# Patient Record
Sex: Female | Born: 1985
Health system: Southern US, Community
[De-identification: ages and names within clinical notes are randomized; demographics above are authoritative.]

## PROBLEM LIST (undated history)

## (undated) ENCOUNTER — Inpatient Hospital Stay (HOSPITAL_COMMUNITY): Payer: Self-pay

## (undated) DIAGNOSIS — F419 Anxiety disorder, unspecified: Secondary | ICD-10-CM

## (undated) DIAGNOSIS — O24419 Gestational diabetes mellitus in pregnancy, unspecified control: Secondary | ICD-10-CM

## (undated) DIAGNOSIS — S8291XA Unspecified fracture of right lower leg, initial encounter for closed fracture: Secondary | ICD-10-CM

## (undated) DIAGNOSIS — L309 Dermatitis, unspecified: Secondary | ICD-10-CM

## (undated) HISTORY — DX: Gestational diabetes mellitus in pregnancy, unspecified control: O24.419

## (undated) HISTORY — PX: MOUTH SURGERY: SHX715

---
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
## 2007-10-05 ENCOUNTER — Ambulatory Visit (HOSPITAL_COMMUNITY): Admission: RE | Admit: 2007-10-05 | Discharge: 2007-10-05 | Payer: Self-pay | Admitting: Emergency Medicine

## 2010-12-28 ENCOUNTER — Encounter (INDEPENDENT_AMBULATORY_CARE_PROVIDER_SITE_OTHER): Payer: Self-pay | Admitting: Surgery

## 2010-12-28 ENCOUNTER — Ambulatory Visit (INDEPENDENT_AMBULATORY_CARE_PROVIDER_SITE_OTHER): Payer: BC Managed Care – PPO | Admitting: Surgery

## 2010-12-28 VITALS — BP 122/76 | HR 60 | Temp 97.2°F | Resp 16 | Ht 64.0 in | Wt 136.1 lb

## 2010-12-28 DIAGNOSIS — K8000 Calculus of gallbladder with acute cholecystitis without obstruction: Secondary | ICD-10-CM

## 2010-12-28 NOTE — Patient Instructions (Signed)
Laparoscopic Cholecystectomy Laparoscopic cholecystectomy is surgery to remove the gallbladder. The gallbladder is located slightly to the right of center in the abdomen, behind the liver. It is a concentrating and storage sac for the bile produced in the liver. Bile aids in the digestion and absorption of fats. Gallbladder disease (cholecystitis) is an inflammation of your gallbladder. This condition is usually caused by a buildup of gallstones (cholelithiasis) in your gallbladder. Gallstones can block the flow of bile, resulting in inflammation and pain. In severe cases, emergency surgery may be required. When emergency surgery is not required, you will have time to prepare for the procedure. Laparoscopic surgery is an alternative to open surgery. Laparoscopic surgery usually has a shorter recovery time. Your common bile duct may also need to be examined and explored. Your caregiver will discuss this with you if he or she feels this should be done. If stones are found in the common bile duct, they may be removed. LET YOUR CAREGIVER KNOW ABOUT:  Allergies to food or medicine.   Medicines taken, including vitamins, herbs, eyedrops, over-the-counter medicines, and creams.   Use of steroids (by mouth or creams).   Previous problems with anesthetics or numbing medicines.   History of bleeding problems or blood clots.   Previous surgery.   Other health problems, including diabetes and kidney problems.   Possibility of pregnancy, if this applies.  RISKS AND COMPLICATIONS All surgery is associated with risks. Some problems that may occur following this procedure include:  Infection.   Damage to the common bile duct, nerves, arteries, veins, or other internal organs such as the stomach or intestines.   Bleeding.   A stone may remain in the common bile duct.  BEFORE THE PROCEDURE  Do not take aspirin for 3 days prior to surgery or blood thinners for 1 week prior to surgery.   Do not eat or  drink anything after midnight the night before surgery.   Let your caregiver know if you develop a cold or other infectious problem prior to surgery.   You should be present 60 minutes before the procedure or as directed.  PROCEDURE  You will be given medicine that makes you sleep (general anesthetic). When you are asleep, your surgeon will make several small cuts (incisions) in your abdomen. One of these incisions is used to insert a small, lighted scope (laparoscope) into the abdomen. The laparoscope helps the surgeon see into your abdomen. Carbon dioxide gas will be pumped into your abdomen. The gas allows more room for the surgeon to perform your surgery. Other operating instruments are inserted through the other incisions. Laparoscopic procedures may not be appropriate when:  There is major scarring from previous surgery.   The gallbladder is extremely inflamed.   There are bleeding disorders or unexpected cirrhosis of the liver.   A pregnancy is near term.   Other conditions make the laparoscopic procedure impossible.  If your surgeon feels it is not safe to continue with a laparoscopic procedure, he or she will perform an open abdominal procedure. In this case, the surgeon will make an incision to open the abdomen. This gives the surgeon a larger view and field to work within. This may allow the surgeon to perform procedures that sometimes cannot be performed with a laparoscope alone. Open surgery has a longer recovery time. AFTER THE PROCEDURE  You will be taken to the recovery area where a nurse will watch and check your progress.   You may be allowed to go home   the same day.   Do not resume physical activities until directed by your caregiver.   You may resume a normal diet and activities as directed.  Document Released: 12/25/2004 Document Revised: 09/06/2010 Document Reviewed: 06/09/2010 [facility] Patient Information 2012 [place], [place].Cholelithiasis Cholelithiasis (also  called gallstones) is a form of gallbladder disease where gallstones form in your gallbladder. The gallbladder is a non-essential organ that stores bile made in the liver, which helps digest fats. Gallstones begin as small crystals and slowly grow into stones. Gallstone pain occurs when the gallbladder spasms, and a gallstone is blocking the duct. Pain can also occur when a stone passes out of the duct.  Women are more likely to develop gallstones than men. Other factors that increase the risk of gallbladder disease are:  Having multiple pregnancies. Physicians sometimes advise removing diseased gallbladders before future pregnancies.   Obesity.   Diets heavy in fried foods and fat.   Increasing age (older than 50).   Prolonged use of medications containing female hormones.   Diabetes mellitus.   Rapid weight loss.   Family history of gallstones (heredity).  SYMPTOMS  Feeling sick to your stomach (nauseous).   Abdominal pain.   Yellowing of the skin (jaundice).   Sudden pain. It may persist from several minutes to several hours.   Worsening pain with deep breathing or when jarred.   Fever.   Tenderness to the touch.  In some cases, when gallstones do not move into the bile duct, people have no pain or symptoms. These are called "silent" gallstones. TREATMENT In severe cases, emergency surgery may be required. HOME CARE INSTRUCTIONS   Only take over-the-counter or prescription medicines for pain, discomfort, or fever as directed by your caregiver.   Follow a low-fat diet until seen again. Fat causes the gallbladder to contract, which can result in pain.   Follow up as instructed. Attacks are almost always recurrent and surgery is usually required for permanent treatment.  SEEK IMMEDIATE MEDICAL CARE IF:   Your pain increases and is not controlled by medications.   You have an oral temperature above 102 F (38.9 C), not controlled by medication.   You develop nausea  and vomiting.  MAKE SURE YOU:   Understand these instructions.   Will watch your condition.   Will get help right away if you are not doing well or get worse.  Document Released: 12/21/2004 Document Revised: 09/06/2010 Document Reviewed: 02/23/2010 [facility] Patient Information 2012 [place], [place].  Cholelithiasis Cholelithiasis (also called gallstones) is a form of gallbladder disease where gallstones form in your gallbladder. The gallbladder is a non-essential organ that stores bile made in the liver, which helps digest fats. Gallstones begin as small crystals and slowly grow into stones. Gallstone pain occurs when the gallbladder spasms, and a gallstone is blocking the duct. Pain can also occur when a stone passes out of the duct.  Women are more likely to develop gallstones than men. Other factors that increase the risk of gallbladder disease are:  Having multiple pregnancies. Physicians sometimes advise removing diseased gallbladders before future pregnancies.   Obesity.   Diets heavy in fried foods and fat.   Increasing age (older than 25).   Prolonged use of medications containing female hormones.   Diabetes mellitus.   Rapid weight loss.   Family history of gallstones (heredity).  SYMPTOMS  Feeling sick to your stomach (nauseous).   Abdominal pain.   Yellowing of the skin (jaundice).   Sudden pain. It may persist from several  minutes to several hours.   Worsening pain with deep breathing or when jarred.   Fever.   Tenderness to the touch.  In some cases, when gallstones do not move into the bile duct, people have no pain or symptoms. These are called "silent" gallstones. TREATMENT In severe cases, emergency surgery may be required. HOME CARE INSTRUCTIONS   Only take over-the-counter or prescription medicines for pain, discomfort, or fever as directed by your caregiver.   Follow a low-fat diet until seen again. Fat causes the gallbladder to contract, which  can result in pain.   Follow up as instructed. Attacks are almost always recurrent and surgery is usually required for permanent treatment.  SEEK IMMEDIATE MEDICAL CARE IF:   Your pain increases and is not controlled by medications.   You have an oral temperature above 102 F (38.9 C), not controlled by medication.   You develop nausea and vomiting.  MAKE SURE YOU:   Understand these instructions.   Will watch your condition.   Will get help right away if you are not doing well or get worse.  Document Released: 12/21/2004 Document Revised: 09/06/2010 Document Reviewed: 02/23/2010 [facility] Patient Information 2012 [place], [place].

## 2010-12-28 NOTE — Progress Notes (Signed)
Patient ID: Catherine Patel, female   DOB: [birth year], 25 y.o.   MRN: [id number]  Chief Complaint  Patient presents with  . New Evaluation    eval of Gb with stones     HPI Catherine Patel is a 25 y.o. female. HPI The patient was sent at the request of Dr. Evette Cristal due to epigastric abdominal pain and nausea. She's had these symptoms for one month. The symptoms last hours and go away on room. The discomfort is located in her epigastrium and right upper quadrant and follows eating. The discomfort is a 3-4/10 and radiates to her back. Diet modification has helped to alleviate some of the symptoms. The nausea is the dominant symptom after meals. TUMS does help some.  Past Medical History  Diagnosis Date  . Asthma     History reviewed. No pertinent past surgical history.  History reviewed. No pertinent family history.  Social History History  Substance Use Topics  . Smoking status: Never Smoker   . Smokeless tobacco: Never Used  . Alcohol Use: Yes    Allergies  Allergen Reactions  . Codeine     Current Outpatient Prescriptions  Medication Sig Dispense Refill  . esomeprazole (NEXIUM) 10 MG packet Take 10 mg by mouth daily before breakfast.        . ValACYclovir HCl (VALTREX PO) Take 0.5 mg by mouth daily.          Review of Systems Review of Systems  Constitutional: Negative for fever, chills and unexpected weight change.  HENT: Negative for hearing loss, congestion, sore throat, trouble swallowing and voice change.   Eyes: Negative for visual disturbance.  Respiratory: Negative for cough and wheezing.   Cardiovascular: Negative for chest pain, palpitations and leg swelling.  Gastrointestinal: Negative for nausea, vomiting, abdominal pain, diarrhea, constipation, blood in stool, abdominal distention and anal bleeding.  Genitourinary: Negative for hematuria, vaginal bleeding and difficulty urinating.  Musculoskeletal: Negative for arthralgias.  Skin: Negative for rash and wound.   Neurological: Negative for seizures, syncope and headaches.  Hematological: Negative for adenopathy. Does not bruise/bleed easily.  Psychiatric/Behavioral: Negative for confusion.    Blood pressure 122/76, pulse 60, temperature 97.2 F (36.2 C), temperature source Temporal, resp. rate 16, height 5\' 4"  (1.626 m), weight 136 lb 2 oz (61.746 kg).  Physical Exam Physical Exam  Constitutional: She is oriented to person, place, and time. She appears well-developed and well-nourished.  HENT:  Head: Normocephalic and atraumatic.  Eyes: EOM are normal. Pupils are equal, round, and reactive to light.  Neck: Normal range of motion. Neck supple.  Cardiovascular: Normal rate and regular rhythm.   Pulmonary/Chest: Effort normal and breath sounds normal.  Abdominal: Soft. Bowel sounds are normal. There is no tenderness. There is no rebound.  Musculoskeletal: Normal range of motion.  Neurological: She is alert and oriented to person, place, and time.  Skin: Skin is warm.  Psychiatric: She has a normal mood and affect. Her behavior is normal. Judgment and thought content normal.    Data Reviewed U/S abdomen gallstones LFT normal CBC normal  Assessment    Symptomatic cholelithiasis without obstruction    Plan    I discussed treatment options for condition. Risks, benefits and medical therapies were discussed today as well surgical therapy for cholelithiasis which is symptomatic. She would like to proceed with laparoscopic cholecystectomy with cholangiogram.The procedure has been discussed with the patient. Operative and non operative treatments have been discussed. Risks of surgery include bleeding, infection,  Common bile  duct injury,  Injury to the stomach,liver, colon,small intestine, abdominal wall,  Diaphragm,  Major blood vessels,  And the need for an open procedure.  Other risks include worsening of medical problems, death,  DVT and pulmonary embolism, and cardiovascular events.   Medical  options have also been discussed. The patient has been informed of long term expectations of surgery and non surgical options,  The patient agrees to proceed.         Tamsyn Owusu A. 12/28/2010, 10:08 AM

## 2011-01-05 ENCOUNTER — Encounter (INDEPENDENT_AMBULATORY_CARE_PROVIDER_SITE_OTHER): Payer: Self-pay

## 2011-01-10 ENCOUNTER — Other Ambulatory Visit (INDEPENDENT_AMBULATORY_CARE_PROVIDER_SITE_OTHER): Payer: Self-pay | Admitting: Surgery

## 2011-01-10 DIAGNOSIS — K824 Cholesterolosis of gallbladder: Secondary | ICD-10-CM

## 2011-01-10 DIAGNOSIS — K8010 Calculus of gallbladder with chronic cholecystitis without obstruction: Secondary | ICD-10-CM

## 2011-01-10 HISTORY — PX: CHOLECYSTECTOMY: SHX55

## 2011-01-18 ENCOUNTER — Ambulatory Visit: Payer: Self-pay | Admitting: Internal Medicine

## 2011-01-19 ENCOUNTER — Ambulatory Visit (INDEPENDENT_AMBULATORY_CARE_PROVIDER_SITE_OTHER): Payer: BC Managed Care – PPO | Admitting: Surgery

## 2011-01-19 ENCOUNTER — Encounter (INDEPENDENT_AMBULATORY_CARE_PROVIDER_SITE_OTHER): Payer: Self-pay | Admitting: Surgery

## 2011-01-19 VITALS — BP 102/68 | HR 66 | Temp 97.2°F | Resp 16 | Ht 64.0 in | Wt 137.6 lb

## 2011-01-19 DIAGNOSIS — Z9889 Other specified postprocedural states: Secondary | ICD-10-CM

## 2011-01-19 NOTE — Patient Instructions (Signed)
Follow up if symptoms don.t improve over the next 2 weeks.

## 2011-01-19 NOTE — Progress Notes (Signed)
Patient ID: [name], female   DOB: Oct 25, 1985, 26 y.o.   MRN: [id number] NAME: Catherine Patel       DOB: [birth year]           DATE: 01/19/2011       WUJ:[id number]   CC: Postop laparoscopic cholecystectomy  HPI:  This patient underwent a laparoscopic cholecystectomy  andoperative cholangiogram on 01/10/2011. She is in for her first postoperative visit. She notes that her incisional pain has resolved. Her preoperative symptoms have improved. She is not having problems with nausea, vomiting, diarrhea, fevers, chills, or urinary symptoms. She is tolerating diet. She feels that she is progressing well and nearly back to normal. PE: General: The patient is alert and appears comfortable, NAD.  Abdomen: Soft and benign. The incisions are healing nicely. There are no apparent problems.  Data reviewed: IOC:  normal Pathology:  gallstones and inflammation  Impression:  The patient appears to be doing well, with improvement in her symptoms.  Plan:  She may resume full activity and regular diet. She  will followup with [place] on a p.r.n. basis. I did tell her that she may still have some foods that cause indigestion and ask her to call us if there are any questions, problems or concerns.

## 2011-01-29 ENCOUNTER — Encounter (INDEPENDENT_AMBULATORY_CARE_PROVIDER_SITE_OTHER): Payer: Self-pay | Admitting: Surgery

## 2011-02-12 ENCOUNTER — Telehealth (INDEPENDENT_AMBULATORY_CARE_PROVIDER_SITE_OTHER): Payer: Self-pay

## 2011-02-12 NOTE — Telephone Encounter (Signed)
Pt states she has been having increasing anxiety and panic attacks since Jan. 2013. Pt asked what could be causing these. I advised pt to call Dr Andi Devon her PCP to review these symptoms. Pt states her wounds are healed and otherwise doing well since surgery.  Pt advised we do not treat anxiety disorders and it is very important that she follow up with her PCP to work up these symptoms. Pt states she will call him.

## 2012-07-21 LAB — OB RESULTS CONSOLE GC/CHLAMYDIA
CHLAMYDIA, DNA PROBE: NEGATIVE
GC PROBE AMP, GENITAL: NEGATIVE

## 2012-07-21 LAB — OB RESULTS CONSOLE HEPATITIS B SURFACE ANTIGEN: HEP B S AG: NEGATIVE

## 2012-07-21 LAB — OB RESULTS CONSOLE ABO/RH: RH TYPE: POSITIVE

## 2012-07-21 LAB — OB RESULTS CONSOLE HIV ANTIBODY (ROUTINE TESTING): HIV: NONREACTIVE

## 2012-07-21 LAB — OB RESULTS CONSOLE ANTIBODY SCREEN: Antibody Screen: NEGATIVE

## 2012-07-21 LAB — OB RESULTS CONSOLE RPR: RPR: NONREACTIVE

## 2012-07-21 LAB — OB RESULTS CONSOLE RUBELLA ANTIBODY, IGM: Rubella: IMMUNE

## 2012-10-06 ENCOUNTER — Other Ambulatory Visit: Payer: Self-pay

## 2012-10-07 ENCOUNTER — Other Ambulatory Visit (HOSPITAL_COMMUNITY): Payer: Self-pay | Admitting: Obstetrics and Gynecology

## 2012-10-07 DIAGNOSIS — IMO0002 Reserved for concepts with insufficient information to code with codable children: Secondary | ICD-10-CM

## 2012-10-07 DIAGNOSIS — Z0489 Encounter for examination and observation for other specified reasons: Secondary | ICD-10-CM

## 2012-10-08 ENCOUNTER — Encounter (HOSPITAL_COMMUNITY): Payer: Self-pay

## 2012-10-08 ENCOUNTER — Ambulatory Visit (HOSPITAL_COMMUNITY)
Admission: RE | Admit: 2012-10-08 | Discharge: 2012-10-08 | Disposition: A | Discharge status: Home / Self Care | Payer: BC Managed Care – PPO | Source: Ambulatory Visit | Attending: Obstetrics and Gynecology | Admitting: Obstetrics and Gynecology

## 2012-10-08 ENCOUNTER — Other Ambulatory Visit (HOSPITAL_COMMUNITY): Payer: Self-pay | Admitting: Obstetrics and Gynecology

## 2012-10-08 VITALS — BP 113/66 | HR 68

## 2012-10-08 DIAGNOSIS — IMO0002 Reserved for concepts with insufficient information to code with codable children: Secondary | ICD-10-CM

## 2012-10-08 DIAGNOSIS — Z3490 Encounter for supervision of normal pregnancy, unspecified, unspecified trimester: Secondary | ICD-10-CM

## 2012-10-08 DIAGNOSIS — Z0489 Encounter for examination and observation for other specified reasons: Secondary | ICD-10-CM

## 2012-10-08 DIAGNOSIS — Z1389 Encounter for screening for other disorder: Secondary | ICD-10-CM | POA: Insufficient documentation

## 2012-10-08 DIAGNOSIS — A6000 Herpesviral infection of urogenital system, unspecified: Secondary | ICD-10-CM | POA: Insufficient documentation

## 2012-10-08 DIAGNOSIS — Z363 Encounter for antenatal screening for malformations: Secondary | ICD-10-CM | POA: Insufficient documentation

## 2012-10-08 DIAGNOSIS — O358XX0 Maternal care for other (suspected) fetal abnormality and damage, not applicable or unspecified: Secondary | ICD-10-CM | POA: Insufficient documentation

## 2012-10-08 DIAGNOSIS — O98519 Other viral diseases complicating pregnancy, unspecified trimester: Secondary | ICD-10-CM | POA: Insufficient documentation

## 2012-10-08 NOTE — Progress Notes (Signed)
Tana Coast Mckinstry  was seen today for an ultrasound appointment.  See full report in [facility].  Comments: Ms. Gelles was seen due to incomplete views of the fetal anatomy by ultrasound earlier this week and possible Premature atrial contaction.  The fetal anatomic survey was within normal limits.  The fetal heart structure appears normal.  On initial evaluation, the fetus appeared to be in normal sinus rhythm at a rate of 145 bpm.  Later during the course of the exam, a fetal arrhythmia was noted  - frequent PACs vs. atrial bigemminy with a normal ventricular rate.  No evidence of fetal hydrops noted.  Based on these findings, the patient elected to undergo a formal fetal echo that was scheduled for later this week.  Impression: Single IUP at 18 5/7 weeks Normal fetal anatomic survey Fetal arrhythmia - frequent PACs vs. atrial bigemminy No evidence of fetal hydrops No markers associated with aneuploidy noted Normal amniotic fluid volume  Recommendations: Fetal echo (scheduled for later this week) Follow up in 4 weeks for growth, reevaluate fetal anatomy.  Alpha Gula, MD

## 2012-10-09 ENCOUNTER — Encounter: Payer: Self-pay | Admitting: Obstetrics and Gynecology

## 2012-11-05 ENCOUNTER — Ambulatory Visit (HOSPITAL_COMMUNITY)
Admission: RE | Admit: 2012-11-05 | Discharge: 2012-11-05 | Disposition: A | Discharge status: Home / Self Care | Payer: BC Managed Care – PPO | Source: Ambulatory Visit | Attending: Obstetrics and Gynecology | Admitting: Obstetrics and Gynecology

## 2012-11-05 DIAGNOSIS — Z3490 Encounter for supervision of normal pregnancy, unspecified, unspecified trimester: Secondary | ICD-10-CM

## 2012-11-05 DIAGNOSIS — A6000 Herpesviral infection of urogenital system, unspecified: Secondary | ICD-10-CM | POA: Insufficient documentation

## 2012-11-05 DIAGNOSIS — IMO0002 Reserved for concepts with insufficient information to code with codable children: Secondary | ICD-10-CM

## 2012-11-05 DIAGNOSIS — O368390 Maternal care for abnormalities of the fetal heart rate or rhythm, unspecified trimester, not applicable or unspecified: Secondary | ICD-10-CM | POA: Insufficient documentation

## 2012-11-05 DIAGNOSIS — O98519 Other viral diseases complicating pregnancy, unspecified trimester: Secondary | ICD-10-CM | POA: Insufficient documentation

## 2012-11-05 NOTE — Progress Notes (Signed)
Catherine Patel  was seen today for an ultrasound appointment.  See full report in [facility].  Impression: Single IUP at 22 5/7 weeks Follow up due to frequent fetal PACs  Normal fetal echo Interval growth is appropriate (57th %tile) Normal interval anatomy Throughout the course of the exam, normal sinus rhythm was noted without PACs Normal amniotic fluid volume  Recommendations: In general, PACs are a benign dysrhythmia that resolves after delivery.  There is a < 1% risk of PACs developing into fetal SVT.  Recommnend frequent fetal heart rate checks (clinic [place]) at least every over week.  In the absence of fetal tachycardia, no further follow up in needed.  Follow-up ultrasounds as clinically indicated.   Alpha Gula, MD

## 2012-12-17 ENCOUNTER — Encounter: Payer: BC Managed Care – PPO | Attending: Obstetrics and Gynecology

## 2012-12-17 VITALS — Ht 65.0 in | Wt 166.3 lb

## 2012-12-17 DIAGNOSIS — O99810 Abnormal glucose complicating pregnancy: Secondary | ICD-10-CM

## 2012-12-17 DIAGNOSIS — Z713 Dietary counseling and surveillance: Secondary | ICD-10-CM | POA: Insufficient documentation

## 2012-12-18 NOTE — Progress Notes (Signed)
  Patient was seen on 12/17/12 for Gestational Diabetes self-management class at the [facility]. The following learning objectives were met by the patient during this course:   States the definition of Gestational Diabetes  States why dietary management is important in controlling blood glucose  Describes the effects of carbohydrates on blood glucose levels  Demonstrates ability to create a balanced meal plan  Demonstrates carbohydrate counting   States when to check blood glucose levels  Demonstrates proper blood glucose monitoring techniques  States the effect of stress and exercise on blood glucose levels  States the importance of limiting caffeine and abstaining from alcohol and smoking  Plan:  Aim for 2 Carb Choices per meal (30 grams) +/- 1 either way for breakfast Aim for 3 Carb Choices per meal (45 grams) +/- 1 either way from lunch and dinner Aim for 1-2 Carbs per snack Begin reading food labels for Total Carbohydrate and sugar grams of foods Consider  increasing your activity level by walking daily as tolerated Begin checking BG before breakfast and 1-2 hours after first bit of breakfast, lunch and dinner after  as directed by MD  Take medication  as directed by MD  Blood glucose monitor given:  One Touch Ultra Mini Self Monitoring Kit Lot # [id number] X Exp: 03/2014 Blood glucose reading: 77  Patient instructed to monitor glucose levels: FBS: 60 - <90 1 hour: <140 2 hour: <120  Patient received the following handouts:  Nutrition Diabetes and Pregnancy  Carbohydrate Counting List  Meal Planning worksheet  Patient will be seen for follow-up as needed.

## 2013-01-05 ENCOUNTER — Encounter: Payer: Self-pay | Admitting: Obstetrics and Gynecology

## 2013-01-16 ENCOUNTER — Encounter (HOSPITAL_COMMUNITY): Payer: Self-pay | Admitting: *Deleted

## 2013-01-16 ENCOUNTER — Inpatient Hospital Stay (HOSPITAL_COMMUNITY)
Admission: AD | Admit: 2013-01-16 | Discharge: 2013-01-16 | Disposition: A | Discharge status: Home / Self Care | Payer: BC Managed Care – PPO | Source: Ambulatory Visit | Attending: Obstetrics and Gynecology | Admitting: Obstetrics and Gynecology

## 2013-01-16 ENCOUNTER — Inpatient Hospital Stay (HOSPITAL_COMMUNITY): Payer: BC Managed Care – PPO

## 2013-01-16 DIAGNOSIS — O479 False labor, unspecified: Secondary | ICD-10-CM

## 2013-01-16 DIAGNOSIS — R109 Unspecified abdominal pain: Secondary | ICD-10-CM | POA: Insufficient documentation

## 2013-01-16 DIAGNOSIS — O4700 False labor before 37 completed weeks of gestation, unspecified trimester: Secondary | ICD-10-CM | POA: Insufficient documentation

## 2013-01-16 HISTORY — DX: Unspecified fracture of right lower leg, initial encounter for closed fracture: S82.91XA

## 2013-01-16 HISTORY — DX: Dermatitis, unspecified: L30.9

## 2013-01-16 HISTORY — DX: Anxiety disorder, unspecified: F41.9

## 2013-01-16 LAB — WET PREP, GENITAL
CLUE CELLS WET PREP: NONE SEEN
TRICH WET PREP: NONE SEEN
Yeast Wet Prep HPF POC: NONE SEEN

## 2013-01-16 LAB — URINALYSIS, ROUTINE W REFLEX MICROSCOPIC
Bilirubin Urine: NEGATIVE
GLUCOSE, UA: NEGATIVE mg/dL
HGB URINE DIPSTICK: NEGATIVE
KETONES UR: NEGATIVE mg/dL
LEUKOCYTES UA: NEGATIVE
Nitrite: NEGATIVE
Protein, ur: NEGATIVE mg/dL
Specific Gravity, Urine: 1.015 (ref 1.005–1.030)
Urobilinogen, UA: 0.2 mg/dL (ref 0.0–1.0)
pH: 6 (ref 5.0–8.0)

## 2013-01-16 LAB — FETAL FIBRONECTIN: FETAL FIBRONECTIN: NEGATIVE

## 2013-01-16 MED ORDER — NIFEDIPINE 10 MG PO CAPS
10.0000 mg | ORAL_CAPSULE | Freq: Once | ORAL | Status: AC
  Administered 2013-01-16: 10 mg via ORAL
  Filled 2013-01-16: qty 1

## 2013-01-16 NOTE — Discharge Instructions (Signed)
Braxton Hicks Contractions Pregnancy is commonly associated with contractions of the uterus throughout the pregnancy. Towards the end of pregnancy (32 to 34 weeks), these contractions [facility](Braxton [facility]) can develop more often and may become more forceful. This is not true labor because these contractions do not result in opening (dilatation) and thinning of the cervix. They are sometimes difficult to tell apart from true labor because these contractions can be forceful and people have different pain tolerances. You should not feel embarrassed if you go to the hospital with false labor. Sometimes, the only way to tell if you are in true labor is for your caregiver to follow the changes in the cervix. How to tell the difference between true and false labor:  False labor.  The contractions of false labor are usually shorter, irregular and not as hard as those of true labor.  They are often felt in the front of the lower abdomen and in the groin.  They may leave with walking around or changing positions while lying down.  They get weaker and are shorter lasting as time goes on.  These contractions are usually irregular.  They do not usually become progressively stronger, regular and closer together as with true labor.  True labor.  Contractions in true labor last 30 to 70 seconds, become very regular, usually become more intense, and increase in frequency.  They do not go away with walking.  The discomfort is usually felt in the top of the uterus and spreads to the lower abdomen and low back.  True labor can be determined by your caregiver with an exam. This will show that the cervix is dilating and getting thinner. If there are no prenatal problems or other health problems associated with the pregnancy, it is completely safe to be sent home with false labor and await the onset of true labor. HOME CARE INSTRUCTIONS   Keep up with your usual exercises and instructions.  Take medications as  directed.  Keep your regular prenatal appointment.  Eat and drink lightly if you think you are going into labor.  If BH contractions are making you uncomfortable:  Change your activity position from lying down or resting to walking/walking to resting.  Sit and rest in a tub of warm water.  Drink 2 to 3 glasses of water. Dehydration may cause B-H contractions.  Do slow and deep breathing several times an hour. SEEK IMMEDIATE MEDICAL CARE IF:   Your contractions continue to become stronger, more regular, and closer together.  You have a gushing, burst or leaking of fluid from the vagina.  An oral temperature above 102 F (38.9 C) develops.  You have passage of blood-tinged mucus.  You develop vaginal bleeding.  You develop continuous belly (abdominal) pain.  You have low back pain that you never had before.  You feel the baby's head pushing down causing pelvic pressure.  The baby is not moving as much as it used to. Document Released: 12/25/2004 Document Revised: 03/19/2011 Document Reviewed: 10/06/2012 [facility] Patient Information 2014 [place]. Preterm Labor Information Preterm labor is when labor starts at less than 37 weeks of pregnancy. The normal length of a pregnancy is 39 to 41 weeks. CAUSES Often, there is no identifiable underlying cause as to why a woman goes into preterm labor. One of the most common known causes of preterm labor is infection. Infections of the uterus, cervix, vagina, amniotic sac, bladder, kidney, or even the lungs (pneumonia) can cause labor to start. Other suspected causes of  of preterm labor include:  °· Urogenital infections, such as yeast infections and bacterial vaginosis.   °· Uterine abnormalities (uterine shape, uterine septum, fibroids, or bleeding from the placenta).   °· A cervix that has been operated on (it may fail to stay closed).   °· Malformations in the fetus.   °· Multiple gestations (twins, triplets, and so on).    °· Breakage of the amniotic sac.   °RISK FACTORS °· Having a previous history of preterm labor.   °· Having premature rupture of membranes (PROM).   °· Having a placenta that covers the opening of the cervix (placenta previa).   °· Having a placenta that separates from the uterus (placental abruption).   °· Having a cervix that is too weak to hold the fetus in the uterus (incompetent cervix).   °· Having too much fluid in the amniotic sac (polyhydramnios).   °· Taking illegal drugs or smoking while pregnant.   °· Not gaining enough weight while pregnant.   °· Being younger than 18 and older than 27 years old.   °· Having a low socioeconomic status.   °· Being African American. °SYMPTOMS °Signs and symptoms of preterm labor include:  °· Menstrual-like cramps, abdominal pain, or back pain. °· Uterine contractions that are regular, as frequent as six in an hour, regardless of their intensity (may be mild or painful). °· Contractions that start on the top of the uterus and spread down to the lower abdomen and back.   °· A sense of increased pelvic pressure.   °· A watery or bloody mucus discharge that comes from the vagina.   °TREATMENT °Depending on the length of the pregnancy and other circumstances, your health care provider may suggest bed rest. If necessary, there are medicines that can be given to stop contractions and to mature the fetal lungs. If labor happens before 34 weeks of pregnancy, a prolonged hospital stay may be recommended. Treatment depends on the condition of both you and the fetus.  °WHAT SHOULD YOU DO IF YOU THINK YOU ARE IN PRETERM LABOR? °Call your health care provider right away. You will need to go to the hospital to get checked immediately. °HOW CAN YOU PREVENT PRETERM LABOR IN FUTURE PREGNANCIES? °You should:  °· Stop smoking if you smoke.  °· Maintain healthy weight gain and avoid chemicals and drugs that are not necessary. °· Be watchful for any type of infection. °· Inform your health  care provider if you have a known history of preterm labor. °Document Released: 03/17/2003 Document Revised: 08/27/2012 Document Reviewed: 01/28/2012 °[employer]® Patient Information ©2014 [employer]. ° ° °

## 2013-01-16 NOTE — MAU Note (Signed)
Had a sharp pain yesterday morning, only lasted a few seconds.  Went to the bathroom, lg wad of mucous.  Was crampy all day. Called dr, told to push fluids. Still having cramping in lower abd.  Some vag /rectal pressure.

## 2013-01-16 NOTE — MAU Provider Note (Signed)
History     CSN: [id number]  Arrival date and time: 01/16/13 1707   First Provider Initiated Contact with Patient 01/16/13 1825      Chief Complaint  Patient presents with  . Abdominal Cramping   HPI  Ms. Tana Coastllison B Boisselle is a 28 y.o. female G1P0000 at [id number] who presents with complaints of abdominal cramping and increase in vaginal mucous. Pt continued to have abdominal pain and contractions, and pelvic pressure throughout the day. She called her dr. Isidore Moosffice today and they instructed her to come in for evaluation. Last intercourse was greater than 24 hours ago. She states that on occasion she gets a sharp pain that shoots down to her vagina.   OB History   Grav Para Term Preterm Abortions TAB SAB Ect Mult Living   1 0 0 0 0 0 0 0 0 0       Past Medical History  Diagnosis Date  . Asthma   . Gestational diabetes     diet controlled  . Infection     UTI  . Eczema   . Leg fracture, right     as child  . Anxiety     on meds doing well    Past Surgical History  Procedure Laterality Date  . Cholecystectomy  01/10/11    Family History  Problem Relation Age of Onset  . Hypertension Mother   . Hypothyroidism Mother   . Diabetes Father   . Hyperthyroidism Maternal Grandmother   . Cancer Maternal Grandmother     breast x2  . Cancer Maternal Grandfather     oral   . Diabetes Paternal Grandmother   . Diabetes Paternal Grandfather   . Hearing loss Neg Hx     History  Substance Use Topics  . Smoking status: Never Smoker   . Smokeless tobacco: Never Used  . Alcohol Use: Yes     Comment: not in preg    Allergies:  Allergies  Allergen Reactions  . Codeine     Prescriptions prior to admission  Medication Sig Dispense Refill  . acetaminophen (TYLENOL) 325 MG tablet Take 650 mg by mouth every 6 (six) hours as needed for mild pain, moderate pain, fever or headache.      . Docosahexaenoic Acid (PRENATAL DHA) 200 MG CAPS Take 1 capsule by mouth at bedtime.      .  Doxylamine-Pyridoxine (DICLEGIS) 10-10 MG TBEC Take 1-2 tablets by mouth at bedtime as needed and may repeat dose one time if needed.      . sertraline (ZOLOFT) 25 MG tablet Take 25 mg by mouth at bedtime.       . valACYclovir (VALTREX) 1000 MG tablet Take 1,000 mg by mouth daily as needed (for symptoms).       Recent Results (from the past 2160 hour(s))  URINALYSIS, ROUTINE W REFLEX MICROSCOPIC     Status: None   Collection Time    01/16/13  5:30 PM      Result Value Range   Color, Urine YELLOW  YELLOW   APPearance CLEAR  CLEAR   Specific Gravity, Urine 1.015  1.005 - 1.030   pH 6.0  5.0 - 8.0   Glucose, UA NEGATIVE  NEGATIVE mg/dL   Hgb urine dipstick NEGATIVE  NEGATIVE   Bilirubin Urine NEGATIVE  NEGATIVE   Ketones, ur NEGATIVE  NEGATIVE mg/dL   Protein, ur NEGATIVE  NEGATIVE mg/dL   Urobilinogen, UA 0.2  0.0 - 1.0 mg/dL   Nitrite NEGATIVE  NEGATIVE  Leukocytes, UA NEGATIVE  NEGATIVE   Comment: MICROSCOPIC NOT DONE ON URINES WITH NEGATIVE PROTEIN, BLOOD, LEUKOCYTES, NITRITE, OR GLUCOSE <1000 mg/dL.  WET PREP, GENITAL     Status: Abnormal   Collection Time    01/16/13  6:45 PM      Result Value Range   Yeast Wet Prep HPF POC NONE SEEN  NONE SEEN   Trich, Wet Prep NONE SEEN  NONE SEEN   Clue Cells Wet Prep HPF POC NONE SEEN  NONE SEEN   WBC, Wet Prep HPF POC FEW (*) NONE SEEN   Comment: MODERATE BACTERIA SEEN  FETAL FIBRONECTIN     Status: None   Collection Time    01/16/13  6:45 PM      Result Value Range   Fetal Fibronectin NEGATIVE  NEGATIVE    Review of Systems  Gastrointestinal: Positive for abdominal pain. Negative for nausea, vomiting, diarrhea and constipation.       + pelvic pressure   Genitourinary: Negative for dysuria, urgency, frequency and hematuria.       + vaginal discharge; white, thick, mucus like at times No vaginal bleeding. No dysuria.    Physical Exam   Blood pressure 131/89, pulse 79, temperature 98.1 F (36.7 C), temperature source Oral,  resp. rate 18, height 5' 3.5" (1.613 m), weight 78.926 kg (174 lb), last menstrual period 05/22/2012.  Physical Exam  Constitutional: She is oriented to person, place, and time. She appears well-developed and well-nourished. No distress.  HENT:  Head: Normocephalic.  Eyes: Pupils are equal, round, and reactive to light.  Neck: Neck supple.  GI: Soft.  Genitourinary: Vaginal discharge found.  Speculum exam: Vagina - Moderate amount of creamy discharge, no odor Cervix - No contact bleeding Bimanual exam: Wet prep done Chaperone present for exam.   Neurological: She is alert and oriented to person, place, and time.  Skin: Skin is warm. She is not diaphoretic.  Psychiatric: Her behavior is normal.   Dilation: Fingertip (FT in outer os and closed inner os) Effacement (%): 20 Exam by:: Jerrye Bushy NP  Fetal Tracing: Baseline: 145 bpm  Variability: Moderate  Accelerations: 15x15 Decelerations: None Toco: Irregular with uterine irritability    MAU Course  Procedures None  MDM Wet prep Fetal fibronectin; negative  Consulted with Dr. Renaldo Fiddler; she would like patient to go for an [place] to assess cervical length.  Preliminary [place] report shows cervical length 3.4 cm Consulted with Dr. Renaldo Fiddler; reported to her regarding cervical length and negative fetal fibronectin. She would like patient to receive one dose of procardia.   Assessment and Plan  Report given to Sharen Counter who assumes care of the patient  Iona Hansen Rasch, NP 01/16/2013, 6:25 PM   A: 1. Braxton Hicks contractions     P: D/C home with PTL precautions Pt reports feeling improvement after time in MAU and especially after Procardia dose Increase PO fluids F/U in office  Return to MAU as needed  Sharen Counter Certified Nurse-Midwife

## 2013-02-11 LAB — OB RESULTS CONSOLE GBS: GBS: NEGATIVE

## 2013-02-20 ENCOUNTER — Inpatient Hospital Stay (HOSPITAL_COMMUNITY)
Admission: AD | Admit: 2013-02-20 | Discharge: 2013-02-20 | Disposition: A | Discharge status: Home / Self Care | Payer: BC Managed Care – PPO | Source: Ambulatory Visit | Attending: Obstetrics and Gynecology | Admitting: Obstetrics and Gynecology

## 2013-02-20 ENCOUNTER — Encounter (HOSPITAL_COMMUNITY): Payer: Self-pay

## 2013-02-20 DIAGNOSIS — O479 False labor, unspecified: Secondary | ICD-10-CM | POA: Insufficient documentation

## 2013-02-20 LAB — COMPREHENSIVE METABOLIC PANEL
ALK PHOS: 144 U/L — AB (ref 39–117)
ALT: 7 U/L (ref 0–35)
AST: 13 U/L (ref 0–37)
Albumin: 2.5 g/dL — ABNORMAL LOW (ref 3.5–5.2)
BUN: 8 mg/dL (ref 6–23)
CHLORIDE: 104 meq/L (ref 96–112)
CO2: 22 mEq/L (ref 19–32)
Calcium: 9.2 mg/dL (ref 8.4–10.5)
Creatinine, Ser: 0.7 mg/dL (ref 0.50–1.10)
GFR calc Af Amer: >90 mL/min (ref >90)
GFR calc non Af Amer: >90 mL/min (ref >90)
Glucose, Bld: 78 mg/dL (ref 70–99)
POTASSIUM: 4.5 meq/L (ref 3.7–5.3)
SODIUM: 137 meq/L (ref 137–147)
Total Protein: 5.9 g/dL — ABNORMAL LOW (ref 6.0–8.3)

## 2013-02-20 LAB — CBC
HCT: 32.8 % — ABNORMAL LOW (ref 36.0–46.0)
Hemoglobin: 11 g/dL — ABNORMAL LOW (ref 12.0–15.0)
MCH: 29.6 pg (ref 26.0–34.0)
MCHC: 33.5 g/dL (ref 30.0–36.0)
MCV: 88.4 fL (ref 78.0–100.0)
PLATELETS: 223 10*3/uL (ref 150–400)
RBC: 3.71 MIL/uL — ABNORMAL LOW (ref 3.87–5.11)
RDW: 13.1 % (ref 11.5–15.5)
WBC: 9.5 10*3/uL (ref 4.0–10.5)

## 2013-02-20 LAB — AMNISURE RUPTURE OF MEMBRANE (ROM) NOT AT ARMC: Amnisure ROM: NEGATIVE

## 2013-02-20 NOTE — Discharge Instructions (Signed)
Natural Childbirth °Natural childbirth is going through labor and delivery without any drugs to relieve pain. You also do not use fetal monitors, have a cesarean delivery, or get a sugical cut to enlarge the vaginal opening (episiotomy). With the help of a birthing professional (midwife), you will direct your own labor and delivery as you choose. °Many women chose natural childbirth because they feel more in control and in touch with their labor and delivery. They are also concerned about the medications affecting themselves and the baby. °Pregnant women with a high risk pregnancy should not attempt natural childbirth. It is better to deliver the infant in a hospital if an emergency situation arises. Sometimes, the caregiver has to intervene for the health and safety of the mother and infant. °TWO TECHNIQUES FOR NATURAL CHILDBIRTH:  °· The Lamaze method. This method teaches women that having a baby is normal, healthy, and natural. It also teaches the mother to take a neutral position regarding pain medication and anesthesia and to make an informed decision if and when it is right for them. °· The Bradley method (also called husband coached birth). This method teaches the father to be the birth coach and stresses a natural approach. It also encourages exercise and a balanced diet with good nutrition. The exercises teach relaxation and deep breathing techniques. However, there are also classes to prepare the parents for an emergency situation that may occur. °METHODS OF DEALING WITH LABOR PAIN AND DELIVERY: °· Meditation. °· Yoga. °· Hypnosis. °· Acupuncture. °· Massage. °· Changing positions (walking, rocking, showering, leaning on birth balls). °· Lying in warm water or a jacuzzi. °· Find an activity that keeps your mind off of the labor pain. °· Listen to soft music. °· Visual imagery (focus on a particular object). °BEFORE GOING INTO LABOR °· Be sure you and your spouse/partner are in agreement to have natural  childbirth. °· Decide if your caregiver or a midwife will deliver your baby. °· Decide if you will have your baby in the hospital, birthing center, or at home. °· If you have children, make plans to have someone to take care of them when you go to the hospital. °· Know the distance and the time it takes to go to the delivery center. Make a dry run to be sure. °· Have a bag packed with a night gown, bathrobe, and toiletries ready to take when you go into labor. °· Keep phone numbers of your family and friends handy if you need to call someone when you go into labor. °· Your spouse or partner should go to all the teaching classes. °· Talk with your caregiver about the possibility of a medical emergency and what will happen if that occurs. °ADVANTAGES OF NATURAL CHILDBIRTH °· You are in control of your labor and delivery. °· It is safe. °· There are no medications or anesthetics that may affect you and the fetus. °· There are no invasive procedures such as an episiotomy. °· You and your partner will work together, which can increase your bond. °· Meditation, yoga, massage, and breathing exercises can be learned while pregnant and help you when you are in labor and at delivery. °· In most delivery centers, the family and friends can be involved in the labor and delivery process. °DISADVANTAGES OF NATURAL CHILDBIRTH °· You will experience pain during your labor and delivery. °· The methods of helping relieve your labor pains may not work for you. °· You may feel embarrassed, disappointed, and like a failure   if you decide to change your mind during labor and not have natural childbirth. °AFTER THE DELIVERY °· You will be very tired. °· You will be uncomfortable because of your uterus contracting. You will feel soreness around the vagina. °· You may feel cold and shaky.This is a natural reaction. °· You will be excited, overwhelmed, accomplished, and proud to be a mother. °HOME CARE INSTRUCTIONS  °· Follow the advice and  instructions of your caregiver. °· Follow the instructions of your natural childbirth instructor (Lamaze or Bradley Method). °Document Released: 12/08/2007 Document Revised: 03/19/2011 Document Reviewed: 09/01/2012 °ExitCare® Patient Information ©2014 [employer]. ° °

## 2013-02-20 NOTE — MAU Note (Signed)
Patient states she is having contraction about every 5 minutes with a heavier than usual discharge. Denies bleeding and reports good fetal movement.

## 2013-02-26 ENCOUNTER — Inpatient Hospital Stay (HOSPITAL_COMMUNITY)
Admission: AD | Admit: 2013-02-26 | Discharge: 2013-02-26 | Disposition: A | Discharge status: Home / Self Care | Payer: BC Managed Care – PPO | Source: Ambulatory Visit | Attending: Obstetrics and Gynecology | Admitting: Obstetrics and Gynecology

## 2013-02-26 ENCOUNTER — Encounter (HOSPITAL_COMMUNITY): Payer: Self-pay | Admitting: *Deleted

## 2013-02-26 DIAGNOSIS — O479 False labor, unspecified: Secondary | ICD-10-CM | POA: Insufficient documentation

## 2013-02-26 NOTE — MAU Note (Signed)
contractions 

## 2013-02-26 NOTE — Discharge Instructions (Signed)
Braxton Hicks Contractions Pregnancy is commonly associated with contractions of the uterus throughout the pregnancy. Towards the end of pregnancy (32 to 34 weeks), these contractions (Braxton Hicks) can develop more often and may become more forceful. This is not true labor because these contractions do not result in opening (dilatation) and thinning of the cervix. They are sometimes difficult to tell apart from true labor because these contractions can be forceful and people have different pain tolerances. You should not feel embarrassed if you go to the hospital with false labor. Sometimes, the only way to tell if you are in true labor is for your caregiver to follow the changes in the cervix. How to tell the difference between true and false labor:  False labor.  The contractions of false labor are usually shorter, irregular and not as hard as those of true labor.  They are often felt in the front of the lower abdomen and in the groin.  They may leave with walking around or changing positions while lying down.  They get weaker and are shorter lasting as time goes on.  These contractions are usually irregular.  They do not usually become progressively stronger, regular and closer together as with true labor.  True labor.  Contractions in true labor last 30 to 70 seconds, become very regular, usually become more intense, and increase in frequency.  They do not go away with walking.  The discomfort is usually felt in the top of the uterus and spreads to the lower abdomen and low back.  True labor can be determined by your caregiver with an exam. This will show that the cervix is dilating and getting thinner. If there are no prenatal problems or other health problems associated with the pregnancy, it is completely safe to be sent home with false labor and await the onset of true labor. HOME CARE INSTRUCTIONS   Keep up with your usual exercises and instructions.  Take medications as  directed.  Keep your regular prenatal appointment.  Eat and drink lightly if you think you are going into labor.  If BH contractions are making you uncomfortable:  Change your activity position from lying down or resting to walking/walking to resting.  Sit and rest in a tub of warm water.  Drink 2 to 3 glasses of water. Dehydration may cause B-H contractions.  Do slow and deep breathing several times an hour. SEEK IMMEDIATE MEDICAL CARE IF:   Your contractions continue to become stronger, more regular, and closer together.  You have a gushing, burst or leaking of fluid from the vagina.  An oral temperature above 102 F (38.9 C) develops.  You have passage of blood-tinged mucus.  You develop vaginal bleeding.  You develop continuous belly (abdominal) pain.  You have low back pain that you never had before.  You feel the baby's head pushing down causing pelvic pressure.  The baby is not moving as much as it used to. Document Released: 12/25/2004 Document Revised: 03/19/2011 Document Reviewed: 10/06/2012 ExitCare Patient Information 2014 [employer].  Fetal Movement Counts Patient Name: __________________________________________________ Patient Due Date: ____________________ Performing a fetal movement count is highly recommended in high-risk pregnancies, but it is good for every pregnant woman to do. Your caregiver may ask you to start counting fetal movements at 28 weeks of the pregnancy. Fetal movements often increase:  After eating a full meal.  After physical activity.  After eating or drinking something sweet or cold.  At rest. Pay attention to when you feel   the baby is most active. This will help you notice a pattern of your baby's sleep and wake cycles and what factors contribute to an increase in fetal movement. It is important to perform a fetal movement count at the same time each day when your baby is normally most active.  HOW TO COUNT FETAL  MOVEMENTS 1. Find a quiet and comfortable area to sit or lie down on your left side. Lying on your left side provides the best blood and oxygen circulation to your baby. 2. Write down the day and time on a sheet of paper or in a journal. 3. Start counting kicks, flutters, swishes, rolls, or jabs in a 2 hour period. You should feel at least 10 movements within 2 hours. 4. If you do not feel 10 movements in 2 hours, wait 2 3 hours and count again. Look for a change in the pattern or not enough counts in 2 hours. SEEK MEDICAL CARE IF:  You feel less than 10 counts in 2 hours, tried twice.  There is no movement in over an hour.  The pattern is changing or taking longer each day to reach 10 counts in 2 hours.  You feel the baby is not moving as he or she usually does. Date: ____________ Movements: ____________ Start time: ____________ Finish time: ____________  Date: ____________ Movements: ____________ Start time: ____________ Finish time: ____________ Date: ____________ Movements: ____________ Start time: ____________ Finish time: ____________ Date: ____________ Movements: ____________ Start time: ____________ Finish time: ____________ Date: ____________ Movements: ____________ Start time: ____________ Finish time: ____________ Date: ____________ Movements: ____________ Start time: ____________ Finish time: ____________ Date: ____________ Movements: ____________ Start time: ____________ Finish time: ____________ Date: ____________ Movements: ____________ Start time: ____________ Finish time: ____________  Date: ____________ Movements: ____________ Start time: ____________ Finish time: ____________ Date: ____________ Movements: ____________ Start time: ____________ Finish time: ____________ Date: ____________ Movements: ____________ Start time: ____________ Finish time: ____________ Date: ____________ Movements: ____________ Start time: ____________ Finish time: ____________ Date: ____________  Movements: ____________ Start time: ____________ Finish time: ____________ Date: ____________ Movements: ____________ Start time: ____________ Finish time: ____________ Date: ____________ Movements: ____________ Start time: ____________ Finish time: ____________  Date: ____________ Movements: ____________ Start time: ____________ Finish time: ____________ Date: ____________ Movements: ____________ Start time: ____________ Finish time: ____________ Date: ____________ Movements: ____________ Start time: ____________ Finish time: ____________ Date: ____________ Movements: ____________ Start time: ____________ Finish time: ____________ Date: ____________ Movements: ____________ Start time: ____________ Finish time: ____________ Date: ____________ Movements: ____________ Start time: ____________ Finish time: ____________ Date: ____________ Movements: ____________ Start time: ____________ Finish time: ____________  Date: ____________ Movements: ____________ Start time: ____________ Finish time: ____________ Date: ____________ Movements: ____________ Start time: ____________ Finish time: ____________ Date: ____________ Movements: ____________ Start time: ____________ Finish time: ____________ Date: ____________ Movements: ____________ Start time: ____________ Finish time: ____________ Date: ____________ Movements: ____________ Start time: ____________ Finish time: ____________ Date: ____________ Movements: ____________ Start time: ____________ Finish time: ____________ Date: ____________ Movements: ____________ Start time: ____________ Finish time: ____________  Date: ____________ Movements: ____________ Start time: ____________ Finish time: ____________ Date: ____________ Movements: ____________ Start time: ____________ Finish time: ____________ Date: ____________ Movements: ____________ Start time: ____________ Finish time: ____________ Date: ____________ Movements: ____________ Start time:  ____________ Finish time: ____________ Date: ____________ Movements: ____________ Start time: ____________ Finish time: ____________ Date: ____________ Movements: ____________ Start time: ____________ Finish time: ____________ Date: ____________ Movements: ____________ Start time: ____________ Finish time: ____________  Date: ____________ Movements: ____________ Start time: ____________ Finish time: ____________ Date: ____________ Movements: ____________ Start   time: ____________ Finish time: ____________ Date: ____________ Movements: ____________ Start time: ____________ Finish time: ____________ Date: ____________ Movements: ____________ Start time: ____________ Finish time: ____________ Date: ____________ Movements: ____________ Start time: ____________ Finish time: ____________ Date: ____________ Movements: ____________ Start time: ____________ Finish time: ____________ Date: ____________ Movements: ____________ Start time: ____________ Finish time: ____________  Date: ____________ Movements: ____________ Start time: ____________ Finish time: ____________ Date: ____________ Movements: ____________ Start time: ____________ Finish time: ____________ Date: ____________ Movements: ____________ Start time: ____________ Finish time: ____________ Date: ____________ Movements: ____________ Start time: ____________ Finish time: ____________ Date: ____________ Movements: ____________ Start time: ____________ Finish time: ____________ Date: ____________ Movements: ____________ Start time: ____________ Finish time: ____________ Date: ____________ Movements: ____________ Start time: ____________ Finish time: ____________  Date: ____________ Movements: ____________ Start time: ____________ Finish time: ____________ Date: ____________ Movements: ____________ Start time: ____________ Finish time: ____________ Date: ____________ Movements: ____________ Start time: ____________ Finish time: ____________ Date:  ____________ Movements: ____________ Start time: ____________ Finish time: ____________ Date: ____________ Movements: ____________ Start time: ____________ Finish time: ____________ Date: ____________ Movements: ____________ Start time: ____________ Finish time: ____________ Document Released: 01/24/2006 Document Revised: 12/12/2011 Document Reviewed: 10/22/2011 ExitCare Patient Information 2014 [employer].  

## 2013-02-26 NOTE — MAU Note (Signed)
Pt states she started having back pain today after visit in the office and having membranes stripped.

## 2013-02-28 ENCOUNTER — Inpatient Hospital Stay (HOSPITAL_COMMUNITY)
Admission: AD | Admit: 2013-02-28 | Discharge: 2013-03-02 | DRG: 775 | Disposition: A | Discharge status: Home / Self Care | Payer: BC Managed Care – PPO | Source: Ambulatory Visit | Attending: Obstetrics and Gynecology | Admitting: Obstetrics and Gynecology

## 2013-02-28 ENCOUNTER — Encounter (HOSPITAL_COMMUNITY): Payer: Self-pay | Admitting: *Deleted

## 2013-02-28 ENCOUNTER — Inpatient Hospital Stay (HOSPITAL_COMMUNITY): Payer: BC Managed Care – PPO | Admitting: Anesthesiology

## 2013-02-28 ENCOUNTER — Encounter (HOSPITAL_COMMUNITY): Payer: BC Managed Care – PPO | Admitting: Anesthesiology

## 2013-02-28 DIAGNOSIS — Z3490 Encounter for supervision of normal pregnancy, unspecified, unspecified trimester: Secondary | ICD-10-CM

## 2013-02-28 DIAGNOSIS — O99344 Other mental disorders complicating childbirth: Secondary | ICD-10-CM | POA: Diagnosis present

## 2013-02-28 DIAGNOSIS — F411 Generalized anxiety disorder: Secondary | ICD-10-CM | POA: Diagnosis present

## 2013-02-28 LAB — CBC
HEMATOCRIT: 32.6 % — AB (ref 36.0–46.0)
Hemoglobin: 11.3 g/dL — ABNORMAL LOW (ref 12.0–15.0)
MCH: 30.1 pg (ref 26.0–34.0)
MCHC: 34.7 g/dL (ref 30.0–36.0)
MCV: 86.7 fL (ref 78.0–100.0)
Platelets: 253 10*3/uL (ref 150–400)
RBC: 3.76 MIL/uL — AB (ref 3.87–5.11)
RDW: 13.2 % (ref 11.5–15.5)
WBC: 14 10*3/uL — ABNORMAL HIGH (ref 4.0–10.5)

## 2013-02-28 LAB — RPR: RPR Ser Ql: NONREACTIVE

## 2013-02-28 MED ORDER — PHENYLEPHRINE 40 MCG/ML (10ML) SYRINGE FOR IV PUSH (FOR BLOOD PRESSURE SUPPORT): 80.0000 ug | PREFILLED_SYRINGE | INTRAVENOUS | Status: DC | PRN

## 2013-02-28 MED ORDER — PHENYLEPHRINE 40 MCG/ML (10ML) SYRINGE FOR IV PUSH (FOR BLOOD PRESSURE SUPPORT)
80.0000 ug | PREFILLED_SYRINGE | INTRAVENOUS | Status: DC | PRN
  Filled 2013-02-28: qty 2

## 2013-02-28 MED ORDER — EPHEDRINE 5 MG/ML INJ: 10.0000 mg | INTRAVENOUS | Status: DC | PRN

## 2013-02-28 MED ORDER — EPHEDRINE 5 MG/ML INJ
10.0000 mg | INTRAVENOUS | Status: DC | PRN
  Filled 2013-02-28: qty 2

## 2013-02-28 MED ORDER — FENTANYL 2.5 MCG/ML BUPIVACAINE 1/10 % EPIDURAL INFUSION (WH - ANES): 14.0000 mL/h | INTRAMUSCULAR | Status: DC | PRN

## 2013-02-28 MED ORDER — DIPHENHYDRAMINE HCL 50 MG/ML IJ SOLN: 12.5000 mg | INTRAMUSCULAR | Status: DC | PRN

## 2013-02-28 MED ORDER — FENTANYL 2.5 MCG/ML BUPIVACAINE 1/10 % EPIDURAL INFUSION (WH - ANES)
14.0000 mL/h | INTRAMUSCULAR | Status: DC | PRN
  Administered 2013-02-28 (×2): 14 mL/h via EPIDURAL
  Filled 2013-02-28: qty 125

## 2013-02-28 MED ORDER — EPHEDRINE 5 MG/ML INJ
INTRAVENOUS | Status: AC
  Filled 2013-02-28: qty 4

## 2013-02-28 MED ORDER — LACTATED RINGERS IV SOLN
INTRAVENOUS | Status: DC
  Administered 2013-02-28 (×3): via INTRAVENOUS

## 2013-02-28 MED ORDER — OXYTOCIN BOLUS FROM INFUSION: 500.0000 mL | INTRAVENOUS | Status: DC

## 2013-02-28 MED ORDER — LACTATED RINGERS IV SOLN
500.0000 mL | INTRAVENOUS | Status: DC | PRN
  Administered 2013-02-28: 500 mL via INTRAVENOUS

## 2013-02-28 MED ORDER — PHENYLEPHRINE 40 MCG/ML (10ML) SYRINGE FOR IV PUSH (FOR BLOOD PRESSURE SUPPORT)
PREFILLED_SYRINGE | INTRAVENOUS | Status: AC
  Filled 2013-02-28: qty 10

## 2013-02-28 MED ORDER — OXYTOCIN 40 UNITS IN LACTATED RINGERS INFUSION - SIMPLE MED
62.5000 mL/h | INTRAVENOUS | Status: DC
  Filled 2013-02-28: qty 1000

## 2013-02-28 MED ORDER — FENTANYL 2.5 MCG/ML BUPIVACAINE 1/10 % EPIDURAL INFUSION (WH - ANES)
INTRAMUSCULAR | Status: AC
  Administered 2013-02-28: 14 mL/h via EPIDURAL
  Filled 2013-02-28: qty 125

## 2013-02-28 MED ORDER — LIDOCAINE HCL (PF) 1 % IJ SOLN
INTRAMUSCULAR | Status: AC
  Filled 2013-02-28: qty 30

## 2013-02-28 MED ORDER — CITRIC ACID-SODIUM CITRATE 334-500 MG/5ML PO SOLN: 30.0000 mL | ORAL | Status: DC | PRN

## 2013-02-28 MED ORDER — IBUPROFEN 600 MG PO TABS: 600.0000 mg | ORAL_TABLET | Freq: Four times a day (QID) | ORAL | Status: DC | PRN

## 2013-02-28 MED ORDER — ONDANSETRON HCL 4 MG/2ML IJ SOLN: 4.0000 mg | Freq: Four times a day (QID) | INTRAMUSCULAR | Status: DC | PRN

## 2013-02-28 MED ORDER — LACTATED RINGERS IV SOLN: 500.0000 mL | Freq: Once | INTRAVENOUS | Status: DC

## 2013-02-28 MED ORDER — LACTATED RINGERS IV SOLN
500.0000 mL | Freq: Once | INTRAVENOUS | Status: AC
  Administered 2013-02-28: 500 mL via INTRAVENOUS

## 2013-02-28 MED ORDER — LIDOCAINE HCL (PF) 1 % IJ SOLN
30.0000 mL | INTRAMUSCULAR | Status: AC | PRN
  Administered 2013-02-28 (×2): 5 mL via SUBCUTANEOUS

## 2013-02-28 NOTE — Anesthesia Procedure Notes (Signed)
Epidural Patient location during procedure: OB Start time: 02/28/2013 2:17 PM End time: 02/28/2013 2:27 PM  Staffing Anesthesiologist: Aasiya Creasey, CHRIS Performed by: anesthesiologist   Preanesthetic Checklist Completed: patient identified, surgical consent, pre-op evaluation, timeout performed, IV checked, risks and benefits discussed and monitors and equipment checked  Epidural Patient position: sitting Prep: site prepped and draped and DuraPrep Patient monitoring: heart rate, cardiac monitor, continuous pulse ox and blood pressure Approach: midline Location: L3-L4 Injection technique: LOR saline  Needle:  Needle type: Tuohy  Needle gauge: 17 G Needle length: 9 cm Needle insertion depth: 6 cm Catheter type: closed end flexible Catheter size: 19 Gauge Catheter at skin depth: 12 cm Test dose: Other  Assessment Events: blood not aspirated, injection not painful, no injection resistance, negative IV test and no paresthesia  Additional Notes H+P and labs checked, risks and benefits discussed with the patient, consent obtained, procedure tolerated well and without complications.  Reason for block:procedure for pain

## 2013-02-28 NOTE — MAU Note (Signed)
Pt here for labor eval. Has been 3 cm/75%, bloody show. Denies gush of fluid.

## 2013-02-28 NOTE — Progress Notes (Signed)
Pt feeling increased pressure  FHT reassuring cvx anterior lip reducable, +1  A/P:  Will start pushing

## 2013-02-28 NOTE — Anesthesia Preprocedure Evaluation (Signed)
Anesthesia Evaluation  Patient identified by MRN, date of birth, ID band Patient awake    Reviewed: Allergy & Precautions, H&P , NPO status , Patient's Chart, lab work & pertinent test results  History of Anesthesia Complications Negative for: history of anesthetic complications  Airway Mallampati: II TM Distance: >3 FB Neck ROM: Full    Dental  (+) Teeth Intact   Pulmonary asthma ,    Pulmonary exam normal       Cardiovascular negative cardio ROS  Rhythm:Regular Rate:Normal     Neuro/Psych Anxiety negative neurological ROS     GI/Hepatic negative GI ROS, Neg liver ROS,   Endo/Other  diabetesGestational, no meds  Renal/GU negative Renal ROS  negative genitourinary   Musculoskeletal negative musculoskeletal ROS (+)   Abdominal   Peds  Hematology negative hematology ROS (+)   Anesthesia Other Findings   Reproductive/Obstetrics (+) Pregnancy                           Anesthesia Physical Anesthesia Plan  ASA: II  Anesthesia Plan: Epidural   Post-op Pain Management:    Induction:   Airway Management Planned: Natural Airway  Additional Equipment: None  Intra-op Plan:   Post-operative Plan:   Informed Consent: I have reviewed the patients History and Physical, chart, labs and discussed the procedure including the risks, benefits and alternatives for the proposed anesthesia with the patient or authorized representative who has indicated his/her understanding and acceptance.   Dental advisory given  Plan Discussed with: Anesthesiologist  Anesthesia Plan Comments:         Anesthesia Quick Evaluation

## 2013-02-28 NOTE — Op Note (Deleted)
Cesarean Section Procedure Note   Tana Coastllison B Heiner Primary c-section  Indications: arrest of dilation   Pre-operative Diagnosis: Arrest of dilation   Post-operative Diagnosis: Same   Surgeon: Sharifah Champine   Assistants: none  Anesthesia: epidural   Procedure Details:  The patient was seen in the Holding Room. The risks, benefits, complications, treatment options, and expected outcomes were discussed with the patient. The patient concurred with the proposed plan, giving informed consent. identified as Catherine Patel and the procedure verified as C-Section Delivery. A Time Out was held and the above information confirmed.  After induction of anesthesia, the patient was draped and prepped in the usual sterile manner. A transverse was made and carried down through the subcutaneous tissue to the fascia. Fascial incision was made and extended transversely. The fascia was separated from the underlying rectus tissue superiorly and inferiorly. The peritoneum was identified and entered. Peritoneal incision was extended longitudinally. The utero-vesical peritoneal reflection was incised transversely and the bladder flap was bluntly freed from the lower uterine segment. A low transverse uterine incision was made. Delivered from cephalic presentation was a vigerous female infant with Apgar scores of 8 at one minute and 9 at five minutes. Cord ph was not sent the umbilical cord was clamped and cut cord blood was obtained for evaluation. The placenta was removed Intact and appeared normal. The uterine outline, tubes and ovaries appeared abnormal - 3 small paratubal cysts}. The uterine incision was closed with running locked sutures of 0chromic gut.   Hemostasis was observed. Lavage was carried out until clear.  Peritoneum was closed with 0 monocryl.  The fascia was then reapproximated with running sutures of 0PDS.  The skin was closed with 4-0Vicryl.   Instrument, sponge, and needle counts were correct  prior the abdominal closure and were correct at the conclusion of the case.     Estimated Blood Loss: 600cc   Urine Output: clear  Specimens: placenta   Complications: no complications  Disposition: PACU - hemodynamically stable.   Maternal Condition: stable   Baby condition / location:  Couplet care / Skin to Skin  Attending Attestation: I was present and scrubbed for the entire procedure.   Signed:

## 2013-02-28 NOTE — MAU Note (Signed)
Pt to go to room 162 per HMitchell, RN, charge

## 2013-02-28 NOTE — H&P (Addendum)
Catherine Patel is a 28 y.o. female presenting for labor.  No lof or vb.  Pregnancy uncomplicated.    History OB History   Grav Para Term Preterm Abortions TAB SAB Ect Mult Living   1 0 0 0 0 0 0 0 0 0      Past Medical History  Diagnosis Date  . Asthma   . Eczema   . Leg fracture, right     as child  . Anxiety     on meds doing well  . Gestational diabetes    Past Surgical History  Procedure Laterality Date  . Cholecystectomy  01/10/11   Family History: family history includes Cancer in her maternal grandfather and maternal grandmother; Diabetes in her father, paternal grandfather, and paternal grandmother; Hypertension in her mother; Hyperthyroidism in her maternal grandmother; Hypothyroidism in her mother. There is no history of Hearing loss. Social History:  reports that she has never smoked. She has never used smokeless tobacco. She reports that she drinks alcohol. She reports that she does not use illicit drugs.   Prenatal Transfer Tool  Maternal Diabetes: A1GDM - well controlled Genetic Screening: Normal Maternal Ultrasounds/Referrals: Normal Fetal Ultrasounds or other Referrals:  None Maternal Substance Abuse:  No Significant Maternal Medications:  Valtrex, zoloft Significant Maternal Lab Results:  None Other Comments:  None  ROS  Dilation: 4.5 Effacement (%): 90 Station: -2 Exam by:: Catherine BurnsScarlett Murray, RN Blood pressure 136/84, pulse 90, temperature 98.3 F (36.8 C), temperature source Oral, resp. rate 20, height 5\' 5"  (1.651 m), weight 84.879 kg (187 lb 2 oz), last menstrual period 05/22/2012. Exam Physical Exam  gen - uncomfortable w/ ctx Abd - gravid, NT Ext - NT Cvx 4-5/90/-2 AROM - clear  Prenatal labs: ABO, Rh:   Antibody:   Rubella:   RPR:    HBsAg:    HIV:    GBS:     Assessment/Plan: Admit Exp mngt Epidural prn    Catherine Patel 02/28/2013, 1:44 PM

## 2013-03-01 ENCOUNTER — Encounter (HOSPITAL_COMMUNITY): Payer: Self-pay | Admitting: *Deleted

## 2013-03-01 LAB — CBC
HCT: 28.7 % — ABNORMAL LOW (ref 36.0–46.0)
Hemoglobin: 9.8 g/dL — ABNORMAL LOW (ref 12.0–15.0)
MCH: 30.2 pg (ref 26.0–34.0)
MCHC: 34.1 g/dL (ref 30.0–36.0)
MCV: 88.3 fL (ref 78.0–100.0)
PLATELETS: 213 10*3/uL (ref 150–400)
RBC: 3.25 MIL/uL — ABNORMAL LOW (ref 3.87–5.11)
RDW: 13.4 % (ref 11.5–15.5)
WBC: 20.1 10*3/uL — AB (ref 4.0–10.5)

## 2013-03-01 MED ORDER — WITCH HAZEL-GLYCERIN EX PADS
1.0000 "application " | MEDICATED_PAD | CUTANEOUS | Status: DC | PRN
  Administered 2013-03-01: 1 via TOPICAL

## 2013-03-01 MED ORDER — MEASLES, MUMPS & RUBELLA VAC ~~LOC~~ INJ
0.5000 mL | INJECTION | Freq: Once | SUBCUTANEOUS | Status: DC
Start: 2013-03-02 — End: 2013-03-02
  Filled 2013-03-01: qty 0.5

## 2013-03-01 MED ORDER — TETANUS-DIPHTH-ACELL PERTUSSIS 5-2.5-18.5 LF-MCG/0.5 IM SUSP: 0.5000 mL | Freq: Once | INTRAMUSCULAR | Status: DC

## 2013-03-01 MED ORDER — DIBUCAINE 1 % RE OINT
1.0000 "application " | TOPICAL_OINTMENT | RECTAL | Status: DC | PRN
  Administered 2013-03-01: 1 via RECTAL
  Filled 2013-03-01: qty 28

## 2013-03-01 MED ORDER — SIMETHICONE 80 MG PO CHEW
80.0000 mg | CHEWABLE_TABLET | ORAL | Status: DC | PRN
Start: 2013-03-01 — End: 2013-03-02

## 2013-03-01 MED ORDER — PRENATAL MULTIVITAMIN CH
1.0000 | ORAL_TABLET | Freq: Every day | ORAL | Status: DC
  Administered 2013-03-01: 1 via ORAL
  Filled 2013-03-01: qty 1

## 2013-03-01 MED ORDER — LANOLIN HYDROUS EX OINT: TOPICAL_OINTMENT | CUTANEOUS | Status: DC | PRN

## 2013-03-01 MED ORDER — HYDROCODONE-ACETAMINOPHEN 5-325 MG PO TABS
1.0000 | ORAL_TABLET | ORAL | Status: DC | PRN
  Administered 2013-03-01: 1 via ORAL
  Filled 2013-03-01: qty 1

## 2013-03-01 MED ORDER — SERTRALINE HCL 25 MG PO TABS
25.0000 mg | ORAL_TABLET | Freq: Every day | ORAL | Status: DC
Start: 2013-03-01 — End: 2013-03-02
  Administered 2013-03-01: 25 mg via ORAL
  Filled 2013-03-01: qty 1

## 2013-03-01 MED ORDER — IBUPROFEN 600 MG PO TABS
600.0000 mg | ORAL_TABLET | Freq: Four times a day (QID) | ORAL | Status: DC
  Administered 2013-03-01 – 2013-03-02 (×5): 600 mg via ORAL
  Filled 2013-03-01 (×5): qty 1

## 2013-03-01 MED ORDER — DIPHENHYDRAMINE HCL 25 MG PO CAPS: 25.0000 mg | ORAL_CAPSULE | Freq: Four times a day (QID) | ORAL | Status: DC | PRN

## 2013-03-01 MED ORDER — ONDANSETRON HCL 4 MG/2ML IJ SOLN
4.0000 mg | INTRAMUSCULAR | Status: DC | PRN
Start: 2013-03-01 — End: 2013-03-02

## 2013-03-01 MED ORDER — SENNOSIDES-DOCUSATE SODIUM 8.6-50 MG PO TABS
2.0000 | ORAL_TABLET | ORAL | Status: DC
  Administered 2013-03-01: 2 via ORAL
  Filled 2013-03-01: qty 2

## 2013-03-01 MED ORDER — BENZOCAINE-MENTHOL 20-0.5 % EX AERO
1.0000 "application " | INHALATION_SPRAY | CUTANEOUS | Status: DC | PRN
  Administered 2013-03-01: 1 via TOPICAL
  Filled 2013-03-01: qty 56

## 2013-03-01 MED ORDER — ONDANSETRON HCL 4 MG PO TABS: 4.0000 mg | ORAL_TABLET | ORAL | Status: DC | PRN

## 2013-03-01 MED ORDER — MEDROXYPROGESTERONE ACETATE 150 MG/ML IM SUSP: 150.0000 mg | INTRAMUSCULAR | Status: DC | PRN

## 2013-03-01 NOTE — Progress Notes (Signed)

## 2013-03-01 NOTE — Anesthesia Postprocedure Evaluation (Signed)
Anesthesia Post Note  Patient: Catherine Patel  Procedure(s) Performed: * No procedures listed *  Anesthesia type: Epidural  Patient location: Mother/Baby  Post pain: Pain level controlled  Post assessment: Post-op Vital signs reviewed  Last Vitals:  Filed Vitals:   03/01/13 0815  BP: 95/58  Pulse: 81  Temp: 36.7 C  Resp: 18    Post vital signs: Reviewed  Level of consciousness:alert  Complications: No apparent anesthesia complications

## 2013-03-01 NOTE — Lactation Note (Signed)
This note was copied from the chart of Catherine Patel. Lactation Consultation Note Follow up visit at 22 hours of age.  Mom is holding baby, breastfeeding with wide latch and rhythmic sucking.  Mom has concerns regarding Vicodin and Zoloft with breastfeeding.  Used Dwain SarnaHales book as reference and encouraged mom to continue to breast feed with these meds. MBU RN at bedside for visit.   Patient Name: Catherine Benjaman Pottllison Merrow [id number]'s Date: 03/01/2013 Reason for consult: Follow-up assessment   Maternal Data    Feeding Feeding Type: Breast Fed Length of feed: 45 min  LATCH Score/Interventions Latch: Grasps breast easily, tongue down, lips flanged, rhythmical sucking.  Audible Swallowing: Spontaneous and intermittent  Type of Nipple: Everted at rest and after stimulation  Comfort (Breast/Nipple): Filling, red/small blisters or bruises, mild/mod discomfort  Problem noted: Mild/Moderate discomfort Interventions (Mild/moderate discomfort): Hand expression  Hold (Positioning): No assistance needed to correctly position infant at breast.  LATCH Score: 9  Lactation Tools Discussed/Used     Consult Status Consult Status: Follow-up Date: 03/02/13 Follow-up type: In-patient    Shoptaw, Arvella MerlesJana Lynn 03/01/2013, 10:30 PM

## 2013-03-01 NOTE — Progress Notes (Signed)
SVD of vigerous female infant w/ apgars of 9,10.  Placenta delivered spontaneous w/ 3VC.   1st degree & right periuethral lac repaired w/ 3-0 vicryl rapide.  Fundus firm.  EBL 400cc .

## 2013-03-01 NOTE — Progress Notes (Signed)
Post Partum Day 1 Subjective: no complaints, up ad lib, voiding and tolerating PO  Objective: Blood pressure 95/58, pulse 81, temperature 98 F (36.7 C), temperature source Axillary, resp. rate 18, height 5\' 5"  (1.651 m), weight 84.879 kg (187 lb 2 oz), last menstrual period 05/22/2012, SpO2 99.00%, unknown if currently breastfeeding.  Physical Exam:  General: alert and cooperative Lochia: appropriate Uterine Fundus: firm Incision: n/a DVT Evaluation: No evidence of DVT seen on physical exam.   Recent Labs  02/28/13 1243 03/01/13 0550  HGB 11.3* 9.8*  HCT 32.6* 28.7*    Assessment/Plan: Plan for discharge tomorrow   LOS: 1 day   Catherine Patel 03/01/2013, 10:29 AM

## 2013-03-01 NOTE — Lactation Note (Signed)
This note was copied from the chart of Catherine Benjaman Pottllison Schlesinger. Lactation Consultation Note    Initial consult with this mom and baby, now 10 hours old. Mom was feeding when I walked into the room. She was using cradle hold, and I adivsed cross cradle - she said she was more comfortable for now with cradle, due to her IV in her hand. The baby was latched deeply with strong suckles and visible swallows. I showed mom how to hand express, and she has eaily expressed colostrum. I reviewed lactation and community services, and the breast feeding pages in the baby and me book. Mom knows to call for questions/concerns  Patient Name: Catherine Patel ZOXWR'UToday's Date: 03/01/2013 Reason for consult: Initial assessment   Maternal Data Formula Feeding for Exclusion: No Infant to breast within first hour of birth: Yes Has patient been taught Hand Expression?: Yes Does the patient have breastfeeding experience prior to this delivery?: No  Feeding Feeding Type: Breast Fed Length of feed: 15 min (or more)  LATCH Score/Interventions Latch: Grasps breast easily, tongue down, lips flanged, rhythmical sucking.  Audible Swallowing: A few with stimulation  Type of Nipple: Everted at rest and after stimulation  Comfort (Breast/Nipple): Soft / non-tender     Hold (Positioning): No assistance needed to correctly position infant at breast.  LATCH Score: 9  Lactation Tools Discussed/Used     Consult Status Consult Status: Follow-up Date: 03/02/13 Follow-up type: In-patient    Catherine Patel, Catherine Patel 03/01/2013, 11:16 AM

## 2013-03-02 MED ORDER — HYDROCODONE-ACETAMINOPHEN 5-325 MG PO TABS: 1.0000 | ORAL_TABLET | ORAL | Status: DC | PRN

## 2013-03-02 MED ORDER — IBUPROFEN 600 MG PO TABS: 600.0000 mg | ORAL_TABLET | Freq: Four times a day (QID) | ORAL | Status: AC

## 2013-03-02 MED ORDER — SERTRALINE HCL 25 MG PO TABS: 25.0000 mg | ORAL_TABLET | Freq: Every day | ORAL | Status: DC

## 2013-03-02 NOTE — Discharge Summary (Signed)
Obstetric Discharge Summary Reason for Admission: onset of labor Prenatal Procedures: ultrasound Intrapartum Procedures: spontaneous vaginal delivery Postpartum Procedures: none Complications-Operative and Postpartum: vaginal laceration Hemoglobin  Date Value Ref Range Status  03/01/2013 9.8* 12.0 - 15.0 g/dL Final     HCT  Date Value Ref Range Status  03/01/2013 28.7* 36.0 - 46.0 % Final    Physical Exam:  General: alert and cooperative Lochia: appropriate Uterine Fundus: firm Incision: perineum intact, no hemorrhoids noted DVT Evaluation: No evidence of DVT seen on physical exam. Negative Homan's sign. No cords or calf tenderness. No significant calf/ankle edema.  Discharge Diagnoses: Term Pregnancy-delivered  Discharge Information: Date: 03/02/2013 Activity: pelvic rest Diet: routine Medications: PNV, Ibuprofen, Vicodin and zoloft Condition: stable Instructions: refer to practice specific booklet Discharge to: home   Newborn Data: Live born female  Birth Weight: 8 lb 1.8 oz (3680 g) APGAR: 9, 10  Home with mother.  Jalise Zawistowski G 03/02/2013, 9:00 AM

## 2013-11-09 ENCOUNTER — Encounter (HOSPITAL_COMMUNITY): Payer: Self-pay | Admitting: *Deleted

## 2014-01-08 DIAGNOSIS — S62102A Fracture of unspecified carpal bone, left wrist, initial encounter for closed fracture: Secondary | ICD-10-CM

## 2014-01-08 HISTORY — DX: Fracture of unspecified carpal bone, left wrist, initial encounter for closed fracture: S62.102A

## 2014-04-13 ENCOUNTER — Other Ambulatory Visit: Payer: Self-pay | Admitting: Obstetrics and Gynecology

## 2014-04-14 LAB — CYTOLOGY - PAP

## 2017-03-09 ENCOUNTER — Telehealth: Payer: Self-pay | Admitting: Nurse Practitioner

## 2017-03-09 DIAGNOSIS — J0100 Acute maxillary sinusitis, unspecified: Secondary | ICD-10-CM

## 2017-03-09 MED ORDER — AMOXICILLIN-POT CLAVULANATE 875-125 MG PO TABS: 1.0000 | ORAL_TABLET | Freq: Two times a day (BID) | ORAL | 0 refills | Status: DC

## 2017-03-09 NOTE — Progress Notes (Signed)

## 2017-08-27 DIAGNOSIS — F408 Other phobic anxiety disorders: Secondary | ICD-10-CM | POA: Diagnosis not present

## 2017-08-27 DIAGNOSIS — F331 Major depressive disorder, recurrent, moderate: Secondary | ICD-10-CM | POA: Diagnosis not present

## 2017-08-27 DIAGNOSIS — F411 Generalized anxiety disorder: Secondary | ICD-10-CM | POA: Diagnosis not present

## 2017-08-28 DIAGNOSIS — Z0100 Encounter for examination of eyes and vision without abnormal findings: Secondary | ICD-10-CM | POA: Diagnosis not present

## 2017-09-17 ENCOUNTER — Encounter: Payer: Self-pay | Admitting: Emergency Medicine

## 2017-09-17 ENCOUNTER — Emergency Department (INDEPENDENT_AMBULATORY_CARE_PROVIDER_SITE_OTHER)
Admission: EM | Admit: 2017-09-17 | Discharge: 2017-09-17 | Disposition: A | Discharge status: Home / Self Care | Payer: BLUE CROSS/BLUE SHIELD | Source: Home / Self Care | Attending: Family Medicine | Admitting: Family Medicine

## 2017-09-17 ENCOUNTER — Other Ambulatory Visit: Payer: Self-pay

## 2017-09-17 DIAGNOSIS — M26621 Arthralgia of right temporomandibular joint: Secondary | ICD-10-CM

## 2017-09-17 NOTE — ED Triage Notes (Signed)
Right ear pain x 3 days

## 2017-09-17 NOTE — ED Provider Notes (Signed)
[facility]    CSN: [id number] Arrival date & time: 09/17/17  1617     History   Chief Complaint Chief Complaint  Patient presents with  . Otalgia    HPI Catherine Patel is a 32 y.o. female.   Patient complains of a right earache for 4 days.  She had no improved after taking Allegra, and mild improvement after taking Ibuprofen 400mg .  She has had mild sinus congestion, but no fevers, chills, and sweats.  The history is provided by the patient.    Past Medical History:  Diagnosis Date  . Anxiety    on meds doing well  . Asthma   . Eczema   . Gestational diabetes   . Leg fracture, right    as child    Patient Active Problem List   Diagnosis Date Noted  . SVD (spontaneous vaginal delivery) 03/01/2013  . Pregnancy 02/28/2013  . Gallstones and inflammation of gallbladder without obstruction 12/28/2010    Past Surgical History:  Procedure Laterality Date  . CHOLECYSTECTOMY  01/10/11    OB History    Gravida  1   Para  1   Term  1   Preterm  0   AB  0   Living  1     SAB  0   TAB  0   Ectopic  0   Multiple  0   Live Births  1            Home Medications    Prior to Admission medications   Medication Sig Start Date End Date Taking? Authorizing Provider  FLUoxetine (PROZAC) 40 MG capsule Take 40 mg by mouth daily.   Yes [provider]  acetaminophen (TYLENOL) 325 MG tablet Take 325-650 mg by mouth every 6 (six) hours as needed for mild pain, moderate pain, fever or headache.     [provider]  ibuprofen (ADVIL,MOTRIN) 600 MG tablet Take 1 tablet (600 mg total) by mouth every 6 (six) hours. 03/02/13   Julio Sicks, NP    Family History Family History  Problem Relation Age of Onset  . Hypertension Mother   . Hypothyroidism Mother   . Diabetes Father   . Hyperthyroidism Maternal Grandmother   . Cancer Maternal Grandmother        breast x2  . Cancer Maternal Grandfather        oral   . Diabetes  Paternal Grandmother   . Diabetes Paternal Grandfather   . Hearing loss Neg Hx     Social History Social History   Tobacco Use  . Smoking status: Never Smoker  . Smokeless tobacco: Never Used  Substance Use Topics  . Alcohol use: Yes    Comment: not in preg  . Drug use: No     Allergies   Codeine and Percocet [oxycodone-acetaminophen]   Review of Systems Review of Systems No sore throat No cough No pleuritic pain No wheezing + nasal congestion No post-nasal drainage No sinus pain/pressure No itchy/red eyes + earache No hemoptysis No SOB No fever/chills No nausea No vomiting No abdominal pain No diarrhea No urinary symptoms No skin rash No fatigue No myalgias No headache Used OTC meds without relief   Physical Exam Triage Vital Signs ED Triage Vitals  Enc Vitals Group     BP 09/17/17 1720 128/82     Pulse Rate 09/17/17 1720 81     Resp --      Temp 09/17/17 1720 98.6 F (37  C)     Temp Source 09/17/17 1720 Oral     SpO2 09/17/17 1720 99 %     Weight 09/17/17 1721 185 lb (83.9 kg)     Height 09/17/17 1721 5\' 5"  (1.651 m)     Head Circumference --      Peak Flow --      Pain Score 09/17/17 1721 4     Pain Loc --      Pain Edu? --      Excl. in GC? --    No data found.  Updated Vital Signs BP 128/82 (BP Location: Right Arm)   Pulse 81   Temp 98.6 F (37 C) (Oral)   Ht 5\' 5"  (1.651 m)   Wt 83.9 kg   LMP 09/08/2017 (Exact Date)   SpO2 99%   BMI 30.79 kg/m   Visual Acuity Right Eye Distance:   Left Eye Distance:   Bilateral Distance:    Right Eye Near:   Left Eye Near:    Bilateral Near:     Physical Exam Nursing notes and Vital Signs reviewed. Appearance:  Patient appears stated age, and in no acute distress Eyes:  Pupils are equal, round, and reactive to light and accomodation.  Extraocular movement is intact.  Conjunctivae are not inflamed  Ears:  Canals normal.  Tympanic membranes normal.  There is distinct tenderness over the  right  temporomandibular joint.  Palpation there recreates her pain.  . Nose:  Mildly congested turbinates.  No sinus tenderness.   Pharynx:  Normal Neck:  Supple.  Enlarged posterior/lateral nodes are palpated bilaterally, tender to palpation on the left.   Lungs:  Clear to auscultation.  Breath sounds are equal.  Moving air well. Heart:  Regular rate and rhythm without murmurs, rubs, or gallops.  Abdomen:  Nontender without masses or hepatosplenomegaly.  Bowel sounds are present.  No CVA or flank tenderness.  Extremities:  No edema.  Skin:  No rash present.    UC Treatments / Results  Labs (all labs ordered are listed, but only abnormal results are displayed) Labs Reviewed -   Tympanometry:  Right ear tympanogram normal; Left ear tympanogram normal  EKG None  Radiology No results found.  Procedures Procedures (including critical care time)  Medications Ordered in UC Medications - No data to display  Initial Impression / Assessment and Plan / UC Course  I have reviewed the triage vital signs and the nursing notes.  Pertinent labs & imaging results that were available during my care of the patient were reviewed by me and considered in my medical decision making (see chart for details).    Note tender slightly enlarged left lateral cervical nodes:  Patient may be developing early viral URI   Final Clinical Impressions(s) / UC Diagnoses   Final diagnoses:  Arthralgia of right temporomandibular joint     Discharge Instructions     Take Ibuprofen 200mg , 4 tabs every 8 hours with food for about 3 to 4 days until improved.  Apply ice to the painful area. ? Put ice in a plastic bag. ? Place a towel between your skin and the bag. ? Leave the ice on for 20 minutes, 2-3 times a day.  Consider visiting a dentist.    ED Prescriptions    None         Lattie Haw, MD 09/19/17 1336

## 2017-09-17 NOTE — Discharge Instructions (Addendum)
Take Ibuprofen 200mg , 4 tabs every 8 hours with food for about 3 to 4 days until improved. Apply ice to the painful area. Put ice in a plastic bag. Place a towel between your skin and the bag. Leave the ice on for 20 minutes, 2-3 times a day.  Consider visiting a dentist.

## 2017-10-04 DIAGNOSIS — R635 Abnormal weight gain: Secondary | ICD-10-CM | POA: Diagnosis not present

## 2017-10-04 DIAGNOSIS — Z23 Encounter for immunization: Secondary | ICD-10-CM | POA: Diagnosis not present

## 2017-10-04 DIAGNOSIS — Z1389 Encounter for screening for other disorder: Secondary | ICD-10-CM | POA: Diagnosis not present

## 2017-10-04 DIAGNOSIS — Z86718 Personal history of other venous thrombosis and embolism: Secondary | ICD-10-CM | POA: Diagnosis not present

## 2017-10-04 DIAGNOSIS — L308 Other specified dermatitis: Secondary | ICD-10-CM | POA: Diagnosis not present

## 2017-10-04 DIAGNOSIS — R5383 Other fatigue: Secondary | ICD-10-CM | POA: Diagnosis not present

## 2017-10-04 DIAGNOSIS — Z6831 Body mass index (BMI) 31.0-31.9, adult: Secondary | ICD-10-CM | POA: Diagnosis not present

## 2017-10-04 DIAGNOSIS — R002 Palpitations: Secondary | ICD-10-CM | POA: Diagnosis not present

## 2017-10-17 ENCOUNTER — Emergency Department (INDEPENDENT_AMBULATORY_CARE_PROVIDER_SITE_OTHER)
Admission: EM | Admit: 2017-10-17 | Discharge: 2017-10-17 | Disposition: A | Discharge status: Home / Self Care | Payer: BLUE CROSS/BLUE SHIELD | Source: Home / Self Care | Attending: Family Medicine | Admitting: Family Medicine

## 2017-10-17 ENCOUNTER — Other Ambulatory Visit: Payer: Self-pay

## 2017-10-17 DIAGNOSIS — J0100 Acute maxillary sinusitis, unspecified: Secondary | ICD-10-CM

## 2017-10-17 DIAGNOSIS — H9202 Otalgia, left ear: Secondary | ICD-10-CM

## 2017-10-17 MED ORDER — AMOXICILLIN 500 MG PO CAPS: 500.0000 mg | ORAL_CAPSULE | Freq: Three times a day (TID) | ORAL | 0 refills | Status: DC

## 2017-10-17 NOTE — ED Provider Notes (Signed)
[facility]    CSN: [id number] Arrival date & time: 10/17/17  1110     History   Chief Complaint Chief Complaint  Patient presents with  . Facial Pain    HPI Catherine Patel is a 32 y.o. female.   HPI Catherine Patel is a 32 y.o. female presenting to UC with c/o 6 days of worsening facial pain and pressure with sinus congestion and Left ear pain with popping. Mild cough from the post-nasal drip. She has tried OTC sudafed, mucinex and nasal saline w/o relief.  No known sick contacts. Hx of sinus infections about twice a year. Denies n/v/d. She had chills when symptoms initially started.    Past Medical History:  Diagnosis Date  . Anxiety    on meds doing well  . Asthma   . Eczema   . Gestational diabetes   . Leg fracture, right    as child    Patient Active Problem List   Diagnosis Date Noted  . SVD (spontaneous vaginal delivery) 03/01/2013  . Pregnancy 02/28/2013  . Gallstones and inflammation of gallbladder without obstruction 12/28/2010    Past Surgical History:  Procedure Laterality Date  . CHOLECYSTECTOMY  01/10/11    OB History    Gravida  1   Para  1   Term  1   Preterm  0   AB  0   Living  1     SAB  0   TAB  0   Ectopic  0   Multiple  0   Live Births  1            Home Medications    Prior to Admission medications   Medication Sig Start Date End Date Taking? Authorizing Provider  acetaminophen (TYLENOL) 325 MG tablet Take 325-650 mg by mouth every 6 (six) hours as needed for mild pain, moderate pain, fever or headache.     [provider]  amoxicillin (AMOXIL) 500 MG capsule Take 1 capsule (500 mg total) by mouth 3 (three) times daily. 10/17/17   Lurene Shadow, PA-C  FLUoxetine (PROZAC) 20 MG capsule  10/16/17   [provider]  FLUoxetine (PROZAC) 40 MG capsule Take 40 mg by mouth daily.    [provider]  ibuprofen (ADVIL,MOTRIN) 600 MG tablet Take 1 tablet (600 mg total) by  mouth every 6 (six) hours. 03/02/13   Julio Sicks, NP  ondansetron [facility]) 4 MG tablet  10/17/17   [provider]  pseudoephedrine (SUDAFED) 30 MG tablet Take by mouth.    [provider]    Family History Family History  Problem Relation Age of Onset  . Hypertension Mother   . Hypothyroidism Mother   . Diabetes Father   . Hyperthyroidism Maternal Grandmother   . Cancer Maternal Grandmother        breast x2  . Cancer Maternal Grandfather        oral   . Diabetes Paternal Grandmother   . Diabetes Paternal Grandfather   . Hearing loss Neg Hx     Social History Social History   Tobacco Use  . Smoking status: Never Smoker  . Smokeless tobacco: Never Used  Substance Use Topics  . Alcohol use: Yes    Comment: not in preg  . Drug use: No     Allergies   Codeine and Percocet [oxycodone-acetaminophen]   Review of Systems Review of Systems  Respiratory: Positive for cough. Negative for chest tightness, shortness of breath,  wheezing and stridor.   Gastrointestinal: Negative for diarrhea, nausea and vomiting.  Neurological: Positive for headaches. Negative for dizziness and light-headedness.     Physical Exam Triage Vital Signs ED Triage Vitals  Enc Vitals Group     BP 10/17/17 1142 118/80     Pulse Rate 10/17/17 1142 80     Resp --      Temp 10/17/17 1142 99.4 F (37.4 C)     Temp Source 10/17/17 1142 Oral     SpO2 10/17/17 1142 96 %     Weight 10/17/17 1144 192 lb (87.1 kg)     Height 10/17/17 1144 5\' 5"  (1.651 m)     Head Circumference --      Peak Flow --      Pain Score 10/17/17 1143 3     Pain Loc --      Pain Edu? --      Excl. in GC? --    No data found.  Updated Vital Signs BP 118/80 (BP Location: Right Arm)   Pulse 80   Temp 99.4 F (37.4 C) (Oral)   Ht 5\' 5"  (1.651 m)   Wt 192 lb (87.1 kg)   LMP 10/03/2017   SpO2 96%   BMI 31.95 kg/m   Visual Acuity Right Eye Distance:   Left Eye Distance:   Bilateral Distance:     Right Eye Near:   Left Eye Near:    Bilateral Near:     Physical Exam  Constitutional: She is oriented to person, place, and time. She appears well-developed and well-nourished.  HENT:  Head: Normocephalic and atraumatic.  Right Ear: Tympanic membrane normal.  Left Ear: Tympanic membrane is not erythematous and not bulging. A middle ear effusion is present.  Nose: Mucosal edema present. Right sinus exhibits maxillary sinus tenderness. Right sinus exhibits no frontal sinus tenderness. Left sinus exhibits maxillary sinus tenderness. Left sinus exhibits no frontal sinus tenderness.  Mouth/Throat: Uvula is midline, oropharynx is clear and moist and mucous membranes are normal.  Eyes: EOM are normal.  Neck: Normal range of motion. Neck supple.  Cardiovascular: Normal rate and regular rhythm.  Pulmonary/Chest: Effort normal and breath sounds normal. No stridor. No respiratory distress. She has no wheezes. She has no rales.  Musculoskeletal: Normal range of motion.  Neurological: She is alert and oriented to person, place, and time.  Skin: Skin is warm and dry.  Psychiatric: She has a normal mood and affect. Her behavior is normal.  Nursing note and vitals reviewed.    UC Treatments / Results  Labs (all labs ordered are listed, but only abnormal results are displayed) Labs Reviewed - No data to display  EKG None  Radiology No results found.  Procedures Procedures (including critical care time)  Medications Ordered in UC Medications - No data to display  Initial Impression / Assessment and Plan / UC Course  I have reviewed the triage vital signs and the nursing notes.  Pertinent labs & imaging results that were available during my care of the patient were reviewed by me and considered in my medical decision making (see chart for details).     Hx and exam c/w maxillary sinusitis Will cover for bacterial cause given sinus tenderness and no improvement with OTC  medications.  Final Clinical Impressions(s) / UC Diagnoses   Final diagnoses:  Acute non-recurrent maxillary sinusitis  Left ear pain     Discharge Instructions      Please take antibiotics as prescribed and be  sure to complete entire course even if you start to feel better to ensure infection does not come back.  Please follow up with family medicine in 1 week if not improving.     ED Prescriptions    Medication Sig Dispense Auth. Provider   amoxicillin (AMOXIL) 500 MG capsule  (Status: Discontinued) Take 1 capsule (500 mg total) by mouth 3 (three) times daily. 21 capsule Doroteo Glassman, Jaxyn Mestas O, PA-C   amoxicillin (AMOXIL) 500 MG capsule Take 1 capsule (500 mg total) by mouth 3 (three) times daily. 21 capsule Lurene Shadow, PA-C     Controlled Substance Prescriptions [place] Controlled Substance Registry consulted? Not Applicable   Lurene Shadow, PA-C 10/17/17 4098

## 2017-10-17 NOTE — ED Triage Notes (Signed)
Pt started with facial/ sinus pain on Saturday.  Tuesday, left ear pain pressure, and stuffy nose.

## 2017-10-17 NOTE — Discharge Instructions (Signed)
°  Please take antibiotics as prescribed and be sure to complete entire course even if you start to feel better to ensure infection does not come back. ° °Please follow up with family medicine in 1 week if not improving.  °

## 2017-11-04 DIAGNOSIS — Z6831 Body mass index (BMI) 31.0-31.9, adult: Secondary | ICD-10-CM | POA: Diagnosis not present

## 2017-11-04 DIAGNOSIS — Z01419 Encounter for gynecological examination (general) (routine) without abnormal findings: Secondary | ICD-10-CM | POA: Diagnosis not present

## 2017-11-26 DIAGNOSIS — F411 Generalized anxiety disorder: Secondary | ICD-10-CM | POA: Diagnosis not present

## 2017-11-26 DIAGNOSIS — F331 Major depressive disorder, recurrent, moderate: Secondary | ICD-10-CM | POA: Diagnosis not present

## 2017-11-26 DIAGNOSIS — F408 Other phobic anxiety disorders: Secondary | ICD-10-CM | POA: Diagnosis not present

## 2017-11-28 DIAGNOSIS — Z30432 Encounter for removal of intrauterine contraceptive device: Secondary | ICD-10-CM | POA: Diagnosis not present

## 2018-01-08 NOTE — L&D Delivery Note (Signed)
Delivery Note A viable female was delivered via NSVD  APGAR: 8 9  weight  pending.   Placenta status:spontaneously with 3 vessel cord , .  Cord:  with the following complications: none.  Cord pH: not obtained  Anesthesia:  epidural Episiotomy:  none Lacerations: none   Suture Repair: not applicable Est. Blood Loss (mL):  300  Mom to postpartum.  Baby to Couplet care / Skin to Skin.  Cyril Mourning 11/25/2018, 12:37 PM

## 2018-02-18 DIAGNOSIS — F408 Other phobic anxiety disorders: Secondary | ICD-10-CM | POA: Diagnosis not present

## 2018-02-18 DIAGNOSIS — F411 Generalized anxiety disorder: Secondary | ICD-10-CM | POA: Diagnosis not present

## 2018-02-18 DIAGNOSIS — F331 Major depressive disorder, recurrent, moderate: Secondary | ICD-10-CM | POA: Diagnosis not present

## 2018-03-10 DIAGNOSIS — Z0000 Encounter for general adult medical examination without abnormal findings: Secondary | ICD-10-CM | POA: Diagnosis not present

## 2018-03-10 DIAGNOSIS — R5383 Other fatigue: Secondary | ICD-10-CM | POA: Diagnosis not present

## 2018-03-17 DIAGNOSIS — F418 Other specified anxiety disorders: Secondary | ICD-10-CM | POA: Diagnosis not present

## 2018-03-17 DIAGNOSIS — R635 Abnormal weight gain: Secondary | ICD-10-CM | POA: Diagnosis not present

## 2018-03-17 DIAGNOSIS — Z0000 Encounter for general adult medical examination without abnormal findings: Secondary | ICD-10-CM | POA: Diagnosis not present

## 2018-03-17 DIAGNOSIS — L308 Other specified dermatitis: Secondary | ICD-10-CM | POA: Diagnosis not present

## 2018-03-17 DIAGNOSIS — Z6832 Body mass index (BMI) 32.0-32.9, adult: Secondary | ICD-10-CM | POA: Diagnosis not present

## 2018-04-22 DIAGNOSIS — N911 Secondary amenorrhea: Secondary | ICD-10-CM | POA: Diagnosis not present

## 2018-04-25 DIAGNOSIS — Z3685 Encounter for antenatal screening for Streptococcus B: Secondary | ICD-10-CM | POA: Diagnosis not present

## 2018-04-25 DIAGNOSIS — Z3480 Encounter for supervision of other normal pregnancy, unspecified trimester: Secondary | ICD-10-CM | POA: Diagnosis not present

## 2018-04-25 DIAGNOSIS — Z3481 Encounter for supervision of other normal pregnancy, first trimester: Secondary | ICD-10-CM | POA: Diagnosis not present

## 2018-04-25 LAB — OB RESULTS CONSOLE RUBELLA ANTIBODY, IGM: Rubella: IMMUNE

## 2018-04-25 LAB — OB RESULTS CONSOLE ABO/RH: RH Type: POSITIVE

## 2018-04-25 LAB — OB RESULTS CONSOLE ANTIBODY SCREEN: Antibody Screen: NEGATIVE

## 2018-04-25 LAB — OB RESULTS CONSOLE HIV ANTIBODY (ROUTINE TESTING): HIV: NONREACTIVE

## 2018-04-25 LAB — OB RESULTS CONSOLE HEPATITIS B SURFACE ANTIGEN: Hepatitis B Surface Ag: NEGATIVE

## 2018-04-25 LAB — OB RESULTS CONSOLE GC/CHLAMYDIA
Chlamydia: NEGATIVE
Gonorrhea: NEGATIVE

## 2018-04-25 LAB — OB RESULTS CONSOLE RPR: RPR: NONREACTIVE

## 2018-05-07 DIAGNOSIS — Z3491 Encounter for supervision of normal pregnancy, unspecified, first trimester: Secondary | ICD-10-CM | POA: Diagnosis not present

## 2018-05-07 DIAGNOSIS — Z124 Encounter for screening for malignant neoplasm of cervix: Secondary | ICD-10-CM | POA: Diagnosis not present

## 2018-05-07 DIAGNOSIS — Z113 Encounter for screening for infections with a predominantly sexual mode of transmission: Secondary | ICD-10-CM | POA: Diagnosis not present

## 2018-05-07 DIAGNOSIS — Z331 Pregnant state, incidental: Secondary | ICD-10-CM | POA: Diagnosis not present

## 2018-05-13 DIAGNOSIS — F411 Generalized anxiety disorder: Secondary | ICD-10-CM | POA: Diagnosis not present

## 2018-05-13 DIAGNOSIS — F408 Other phobic anxiety disorders: Secondary | ICD-10-CM | POA: Diagnosis not present

## 2018-05-13 DIAGNOSIS — F331 Major depressive disorder, recurrent, moderate: Secondary | ICD-10-CM | POA: Diagnosis not present

## 2018-05-22 DIAGNOSIS — Z3481 Encounter for supervision of other normal pregnancy, first trimester: Secondary | ICD-10-CM | POA: Diagnosis not present

## 2018-05-22 DIAGNOSIS — Z3A12 12 weeks gestation of pregnancy: Secondary | ICD-10-CM | POA: Diagnosis not present

## 2018-05-22 DIAGNOSIS — Z3682 Encounter for antenatal screening for nuchal translucency: Secondary | ICD-10-CM | POA: Diagnosis not present

## 2018-06-04 ENCOUNTER — Other Ambulatory Visit (HOSPITAL_COMMUNITY): Payer: Self-pay | Admitting: Obstetrics and Gynecology

## 2018-06-04 DIAGNOSIS — R52 Pain, unspecified: Secondary | ICD-10-CM

## 2018-06-05 ENCOUNTER — Other Ambulatory Visit: Payer: Self-pay

## 2018-06-05 ENCOUNTER — Ambulatory Visit (HOSPITAL_COMMUNITY)
Admission: RE | Admit: 2018-06-05 | Discharge: 2018-06-05 | Disposition: A | Discharge status: Home / Self Care | Payer: BLUE CROSS/BLUE SHIELD | Source: Ambulatory Visit | Attending: Obstetrics and Gynecology | Admitting: Obstetrics and Gynecology

## 2018-06-05 DIAGNOSIS — R52 Pain, unspecified: Secondary | ICD-10-CM | POA: Diagnosis not present

## 2018-06-05 NOTE — Progress Notes (Signed)
Right lower extremity venous duplex has been completed. Preliminary results can be found in CV Proc through chart review.  Results were given to Clydie Braun at Dr. Huel Coventry office.  06/05/18 9:23 AM Olen Cordial RVT

## 2018-06-19 DIAGNOSIS — Z20828 Contact with and (suspected) exposure to other viral communicable diseases: Secondary | ICD-10-CM | POA: Diagnosis not present

## 2018-06-20 DIAGNOSIS — R0789 Other chest pain: Secondary | ICD-10-CM | POA: Diagnosis not present

## 2018-06-20 DIAGNOSIS — M79604 Pain in right leg: Secondary | ICD-10-CM | POA: Diagnosis not present

## 2018-06-20 DIAGNOSIS — M79605 Pain in left leg: Secondary | ICD-10-CM | POA: Diagnosis not present

## 2018-06-20 DIAGNOSIS — O26892 Other specified pregnancy related conditions, second trimester: Secondary | ICD-10-CM | POA: Diagnosis not present

## 2018-06-20 DIAGNOSIS — R2243 Localized swelling, mass and lump, lower limb, bilateral: Secondary | ICD-10-CM | POA: Diagnosis not present

## 2018-06-20 DIAGNOSIS — Z3A16 16 weeks gestation of pregnancy: Secondary | ICD-10-CM | POA: Diagnosis not present

## 2018-06-20 DIAGNOSIS — Z79899 Other long term (current) drug therapy: Secondary | ICD-10-CM | POA: Diagnosis not present

## 2018-07-07 DIAGNOSIS — Z361 Encounter for antenatal screening for raised alphafetoprotein level: Secondary | ICD-10-CM | POA: Diagnosis not present

## 2018-07-07 DIAGNOSIS — Z3A18 18 weeks gestation of pregnancy: Secondary | ICD-10-CM | POA: Diagnosis not present

## 2018-07-07 DIAGNOSIS — Z363 Encounter for antenatal screening for malformations: Secondary | ICD-10-CM | POA: Diagnosis not present

## 2018-08-04 DIAGNOSIS — F411 Generalized anxiety disorder: Secondary | ICD-10-CM | POA: Diagnosis not present

## 2018-08-04 DIAGNOSIS — F331 Major depressive disorder, recurrent, moderate: Secondary | ICD-10-CM | POA: Diagnosis not present

## 2018-08-04 DIAGNOSIS — F408 Other phobic anxiety disorders: Secondary | ICD-10-CM | POA: Diagnosis not present

## 2018-09-04 DIAGNOSIS — Z3480 Encounter for supervision of other normal pregnancy, unspecified trimester: Secondary | ICD-10-CM | POA: Diagnosis not present

## 2018-09-04 DIAGNOSIS — Z23 Encounter for immunization: Secondary | ICD-10-CM | POA: Diagnosis not present

## 2018-09-29 DIAGNOSIS — F411 Generalized anxiety disorder: Secondary | ICD-10-CM | POA: Diagnosis not present

## 2018-09-29 DIAGNOSIS — F331 Major depressive disorder, recurrent, moderate: Secondary | ICD-10-CM | POA: Diagnosis not present

## 2018-09-29 DIAGNOSIS — F408 Other phobic anxiety disorders: Secondary | ICD-10-CM | POA: Diagnosis not present

## 2018-10-14 DIAGNOSIS — Z20828 Contact with and (suspected) exposure to other viral communicable diseases: Secondary | ICD-10-CM | POA: Diagnosis not present

## 2018-10-14 DIAGNOSIS — J019 Acute sinusitis, unspecified: Secondary | ICD-10-CM | POA: Diagnosis not present

## 2018-10-17 DIAGNOSIS — Z23 Encounter for immunization: Secondary | ICD-10-CM | POA: Diagnosis not present

## 2018-11-07 DIAGNOSIS — Z3480 Encounter for supervision of other normal pregnancy, unspecified trimester: Secondary | ICD-10-CM | POA: Diagnosis not present

## 2018-11-07 DIAGNOSIS — Z3685 Encounter for antenatal screening for Streptococcus B: Secondary | ICD-10-CM | POA: Diagnosis not present

## 2018-11-07 LAB — OB RESULTS CONSOLE GBS: GBS: NEGATIVE

## 2018-11-14 DIAGNOSIS — O26849 Uterine size-date discrepancy, unspecified trimester: Secondary | ICD-10-CM | POA: Diagnosis not present

## 2018-11-14 DIAGNOSIS — Z3A37 37 weeks gestation of pregnancy: Secondary | ICD-10-CM | POA: Diagnosis not present

## 2018-11-18 ENCOUNTER — Encounter (HOSPITAL_COMMUNITY): Payer: Self-pay | Admitting: *Deleted

## 2018-11-18 ENCOUNTER — Telehealth (HOSPITAL_COMMUNITY): Payer: Self-pay | Admitting: *Deleted

## 2018-11-18 NOTE — Telephone Encounter (Signed)
Preadmission screen  

## 2018-11-19 ENCOUNTER — Telehealth (HOSPITAL_COMMUNITY): Payer: Self-pay | Admitting: *Deleted

## 2018-11-19 ENCOUNTER — Encounter (HOSPITAL_COMMUNITY): Payer: Self-pay | Admitting: *Deleted

## 2018-11-19 NOTE — Telephone Encounter (Signed)
Preadmission screen  

## 2018-11-23 ENCOUNTER — Other Ambulatory Visit: Payer: Self-pay

## 2018-11-23 ENCOUNTER — Other Ambulatory Visit (HOSPITAL_COMMUNITY)
Admission: RE | Admit: 2018-11-23 | Discharge: 2018-11-23 | Disposition: A | Discharge status: Home / Self Care | Payer: BC Managed Care – PPO | Source: Ambulatory Visit | Attending: Obstetrics and Gynecology | Admitting: Obstetrics and Gynecology

## 2018-11-23 DIAGNOSIS — Z20828 Contact with and (suspected) exposure to other viral communicable diseases: Secondary | ICD-10-CM | POA: Diagnosis not present

## 2018-11-23 DIAGNOSIS — Z01812 Encounter for preprocedural laboratory examination: Secondary | ICD-10-CM | POA: Diagnosis not present

## 2018-11-23 LAB — SARS CORONAVIRUS 2 (TAT 6-24 HRS): SARS Coronavirus 2: NEGATIVE

## 2018-11-23 NOTE — MAU Note (Signed)
Pt here for PAT covid swab. Denies symptoms. Swab collected.  

## 2018-11-25 ENCOUNTER — Inpatient Hospital Stay (HOSPITAL_COMMUNITY)
Admission: AD | Admit: 2018-11-25 | Discharge: 2018-11-27 | DRG: 807 | Disposition: A | Discharge status: Home / Self Care | Payer: BC Managed Care – PPO | Attending: Obstetrics and Gynecology | Admitting: Obstetrics and Gynecology

## 2018-11-25 ENCOUNTER — Inpatient Hospital Stay (HOSPITAL_COMMUNITY): Payer: BC Managed Care – PPO | Admitting: Anesthesiology

## 2018-11-25 ENCOUNTER — Encounter (HOSPITAL_COMMUNITY): Payer: Self-pay | Admitting: *Deleted

## 2018-11-25 ENCOUNTER — Other Ambulatory Visit: Payer: Self-pay

## 2018-11-25 ENCOUNTER — Inpatient Hospital Stay (HOSPITAL_COMMUNITY)
Admission: AD | Admit: 2018-11-25 | Payer: BC Managed Care – PPO | Source: Home / Self Care | Admitting: Obstetrics and Gynecology

## 2018-11-25 ENCOUNTER — Inpatient Hospital Stay (HOSPITAL_COMMUNITY): Payer: BC Managed Care – PPO

## 2018-11-25 DIAGNOSIS — D649 Anemia, unspecified: Secondary | ICD-10-CM | POA: Diagnosis present

## 2018-11-25 DIAGNOSIS — O26893 Other specified pregnancy related conditions, third trimester: Secondary | ICD-10-CM | POA: Diagnosis not present

## 2018-11-25 DIAGNOSIS — O99214 Obesity complicating childbirth: Secondary | ICD-10-CM | POA: Diagnosis present

## 2018-11-25 DIAGNOSIS — Z23 Encounter for immunization: Secondary | ICD-10-CM | POA: Diagnosis not present

## 2018-11-25 DIAGNOSIS — J45909 Unspecified asthma, uncomplicated: Secondary | ICD-10-CM | POA: Diagnosis not present

## 2018-11-25 DIAGNOSIS — E669 Obesity, unspecified: Secondary | ICD-10-CM | POA: Diagnosis present

## 2018-11-25 DIAGNOSIS — O9902 Anemia complicating childbirth: Secondary | ICD-10-CM | POA: Diagnosis not present

## 2018-11-25 DIAGNOSIS — Z362 Encounter for other antenatal screening follow-up: Secondary | ICD-10-CM | POA: Diagnosis not present

## 2018-11-25 DIAGNOSIS — Z3A39 39 weeks gestation of pregnancy: Secondary | ICD-10-CM | POA: Diagnosis not present

## 2018-11-25 DIAGNOSIS — O9952 Diseases of the respiratory system complicating childbirth: Secondary | ICD-10-CM | POA: Diagnosis not present

## 2018-11-25 DIAGNOSIS — Z3490 Encounter for supervision of normal pregnancy, unspecified, unspecified trimester: Secondary | ICD-10-CM

## 2018-11-25 DIAGNOSIS — O2492 Unspecified diabetes mellitus in childbirth: Secondary | ICD-10-CM | POA: Diagnosis not present

## 2018-11-25 LAB — TYPE AND SCREEN
ABO/RH(D): A POS
Antibody Screen: NEGATIVE

## 2018-11-25 LAB — CBC
HCT: 33.6 % — ABNORMAL LOW (ref 36.0–46.0)
Hemoglobin: 11.2 g/dL — ABNORMAL LOW (ref 12.0–15.0)
MCH: 29.9 pg (ref 26.0–34.0)
MCHC: 33.3 g/dL (ref 30.0–36.0)
MCV: 89.6 fL (ref 80.0–100.0)
Platelets: 258 10*3/uL (ref 150–400)
RBC: 3.75 MIL/uL — ABNORMAL LOW (ref 3.87–5.11)
RDW: 13.9 % (ref 11.5–15.5)
WBC: 12.1 10*3/uL — ABNORMAL HIGH (ref 4.0–10.5)
nRBC: 0 % (ref 0.0–0.2)

## 2018-11-25 LAB — SYPHILIS: RPR W/REFLEX TO RPR TITER AND TREPONEMAL ANTIBODIES, TRADITIONAL SCREENING AND DIAGNOSIS ALGORITHM: RPR Ser Ql: NONREACTIVE

## 2018-11-25 LAB — ABO/RH: ABO/RH(D): A POS

## 2018-11-25 MED ORDER — LACTATED RINGERS IV SOLN
500.0000 mL | INTRAVENOUS | Status: DC | PRN
  Administered 2018-11-25: 500 mL via INTRAVENOUS

## 2018-11-25 MED ORDER — EPHEDRINE 5 MG/ML INJ: 10.0000 mg | INTRAVENOUS | Status: DC | PRN

## 2018-11-25 MED ORDER — LACTATED RINGERS IV SOLN
INTRAVENOUS | Status: DC
  Administered 2018-11-25 (×2): via INTRAVENOUS

## 2018-11-25 MED ORDER — SODIUM CHLORIDE (PF) 0.9 % IJ SOLN
INTRAMUSCULAR | Status: DC | PRN
  Administered 2018-11-25: 12 mL/h via EPIDURAL

## 2018-11-25 MED ORDER — FLEET ENEMA 7-19 GM/118ML RE ENEM: 1.0000 | ENEMA | Freq: Every day | RECTAL | Status: DC | PRN

## 2018-11-25 MED ORDER — FLUOXETINE HCL 10 MG PO CAPS
10.0000 mg | ORAL_CAPSULE | Freq: Every day | ORAL | Status: DC
  Administered 2018-11-26 – 2018-11-27 (×2): 10 mg via ORAL
  Filled 2018-11-25 (×2): qty 1

## 2018-11-25 MED ORDER — TETANUS-DIPHTH-ACELL PERTUSSIS 5-2.5-18.5 LF-MCG/0.5 IM SUSP: 0.5000 mL | Freq: Once | INTRAMUSCULAR | Status: DC

## 2018-11-25 MED ORDER — ACETAMINOPHEN 325 MG PO TABS: 650.0000 mg | ORAL_TABLET | ORAL | Status: DC | PRN

## 2018-11-25 MED ORDER — DIPHENHYDRAMINE HCL 25 MG PO CAPS: 25.0000 mg | ORAL_CAPSULE | Freq: Four times a day (QID) | ORAL | Status: DC | PRN

## 2018-11-25 MED ORDER — BISACODYL 10 MG RE SUPP: 10.0000 mg | Freq: Every day | RECTAL | Status: DC | PRN

## 2018-11-25 MED ORDER — LACTATED RINGERS IV SOLN: 500.0000 mL | Freq: Once | INTRAVENOUS | Status: DC

## 2018-11-25 MED ORDER — MEASLES, MUMPS & RUBELLA VAC IJ SOLR: 0.5000 mL | Freq: Once | INTRAMUSCULAR | Status: DC

## 2018-11-25 MED ORDER — MEDROXYPROGESTERONE ACETATE 150 MG/ML IM SUSP: 150.0000 mg | INTRAMUSCULAR | Status: DC | PRN

## 2018-11-25 MED ORDER — ONDANSETRON HCL 4 MG PO TABS: 4.0000 mg | ORAL_TABLET | ORAL | Status: DC | PRN

## 2018-11-25 MED ORDER — SOD CITRATE-CITRIC ACID 500-334 MG/5ML PO SOLN: 30.0000 mL | ORAL | Status: DC | PRN

## 2018-11-25 MED ORDER — SENNOSIDES-DOCUSATE SODIUM 8.6-50 MG PO TABS
2.0000 | ORAL_TABLET | ORAL | Status: DC
  Administered 2018-11-25 – 2018-11-26 (×2): 2 via ORAL
  Filled 2018-11-25 (×2): qty 2

## 2018-11-25 MED ORDER — OXYTOCIN 40 UNITS IN NORMAL SALINE INFUSION - SIMPLE MED: 1.0000 m[IU]/min | INTRAVENOUS | Status: DC

## 2018-11-25 MED ORDER — ONDANSETRON HCL 4 MG/2ML IJ SOLN: 4.0000 mg | INTRAMUSCULAR | Status: DC | PRN

## 2018-11-25 MED ORDER — LIDOCAINE HCL (PF) 1 % IJ SOLN
30.0000 mL | INTRAMUSCULAR | Status: DC | PRN
  Filled 2018-11-25: qty 30

## 2018-11-25 MED ORDER — COCONUT OIL OIL: 1.0000 "application " | TOPICAL_OIL | Status: DC | PRN

## 2018-11-25 MED ORDER — PHENYLEPHRINE 40 MCG/ML (10ML) SYRINGE FOR IV PUSH (FOR BLOOD PRESSURE SUPPORT)
80.0000 ug | PREFILLED_SYRINGE | INTRAVENOUS | Status: DC | PRN
  Administered 2018-11-25: 80 ug via INTRAVENOUS

## 2018-11-25 MED ORDER — DIPHENHYDRAMINE HCL 50 MG/ML IJ SOLN: 12.5000 mg | INTRAMUSCULAR | Status: DC | PRN

## 2018-11-25 MED ORDER — VALACYCLOVIR HCL 500 MG PO TABS
500.0000 mg | ORAL_TABLET | Freq: Two times a day (BID) | ORAL | Status: DC
  Administered 2018-11-25 – 2018-11-27 (×4): 500 mg via ORAL
  Filled 2018-11-25 (×4): qty 1

## 2018-11-25 MED ORDER — BENZOCAINE-MENTHOL 20-0.5 % EX AERO
1.0000 "application " | INHALATION_SPRAY | CUTANEOUS | Status: DC | PRN
  Administered 2018-11-26: 1 via TOPICAL
  Filled 2018-11-25: qty 56

## 2018-11-25 MED ORDER — SIMETHICONE 80 MG PO CHEW: 80.0000 mg | CHEWABLE_TABLET | ORAL | Status: DC | PRN

## 2018-11-25 MED ORDER — ZOLPIDEM TARTRATE 5 MG PO TABS: 5.0000 mg | ORAL_TABLET | Freq: Every evening | ORAL | Status: DC | PRN

## 2018-11-25 MED ORDER — WITCH HAZEL-GLYCERIN EX PADS: 1.0000 "application " | MEDICATED_PAD | CUTANEOUS | Status: DC | PRN

## 2018-11-25 MED ORDER — PRENATAL MULTIVITAMIN CH
1.0000 | ORAL_TABLET | Freq: Every day | ORAL | Status: DC
  Administered 2018-11-26: 1 via ORAL
  Filled 2018-11-25: qty 1

## 2018-11-25 MED ORDER — ONDANSETRON HCL 4 MG/2ML IJ SOLN: 4.0000 mg | Freq: Four times a day (QID) | INTRAMUSCULAR | Status: DC | PRN

## 2018-11-25 MED ORDER — FENTANYL-BUPIVACAINE-NACL 0.5-0.125-0.9 MG/250ML-% EP SOLN
12.0000 mL/h | EPIDURAL | Status: DC | PRN
  Filled 2018-11-25 (×2): qty 250

## 2018-11-25 MED ORDER — PHENYLEPHRINE 40 MCG/ML (10ML) SYRINGE FOR IV PUSH (FOR BLOOD PRESSURE SUPPORT)
80.0000 ug | PREFILLED_SYRINGE | INTRAVENOUS | Status: DC | PRN
  Filled 2018-11-25: qty 10

## 2018-11-25 MED ORDER — DIBUCAINE (PERIANAL) 1 % EX OINT: 1.0000 "application " | TOPICAL_OINTMENT | CUTANEOUS | Status: DC | PRN

## 2018-11-25 MED ORDER — OXYTOCIN 40 UNITS IN NORMAL SALINE INFUSION - SIMPLE MED
2.5000 [IU]/h | INTRAVENOUS | Status: DC
  Filled 2018-11-25: qty 1000

## 2018-11-25 MED ORDER — OXYTOCIN BOLUS FROM INFUSION
500.0000 mL | Freq: Once | INTRAVENOUS | Status: AC
  Administered 2018-11-25: 500 mL via INTRAVENOUS

## 2018-11-25 MED ORDER — LACTATED RINGERS IV SOLN
500.0000 mL | Freq: Once | INTRAVENOUS | Status: AC
  Administered 2018-11-25: 500 mL via INTRAVENOUS

## 2018-11-25 MED ORDER — TERBUTALINE SULFATE 1 MG/ML IJ SOLN: 0.2500 mg | Freq: Once | INTRAMUSCULAR | Status: DC | PRN

## 2018-11-25 MED ORDER — IBUPROFEN 600 MG PO TABS
600.0000 mg | ORAL_TABLET | Freq: Four times a day (QID) | ORAL | Status: DC
  Administered 2018-11-25 – 2018-11-27 (×6): 600 mg via ORAL
  Filled 2018-11-25 (×6): qty 1

## 2018-11-25 MED ORDER — LIDOCAINE HCL (PF) 1 % IJ SOLN
INTRAMUSCULAR | Status: DC | PRN
  Administered 2018-11-25 (×2): 4 mL via EPIDURAL

## 2018-11-25 NOTE — Progress Notes (Signed)
[name] was referred for history of depression/anxiety. * Referral screened out by Clinical Social Worker because none of the following criteria appear to apply: ~ History of anxiety/depression during this pregnancy, or of post-partum depression following prior delivery. ~ Diagnosis of anxiety and/or depression within last 3 years. Per further chart review, [name] appears to have been diagnosed with anxiety in 2013.  OR * [name]'s symptoms currently being treated with medication and/or therapy. Per PNC Record for [name], [name] appeared to be on Prozac for anxiety.    Please contact the Clinical Social Worker if needs arise, by [name] request, or if [name] scores greater than 9/yes to question 10 on Edinburgh Postpartum Depression Screen.     Catherine Patel S. Catherine Patel, MSW, LCSW [facility] [phone]   

## 2018-11-25 NOTE — Anesthesia Procedure Notes (Signed)
Epidural Patient location during procedure: OB Start time: 11/25/2018 9:52 AM  Staffing Anesthesiologist: Josephine Igo, MD Performed: anesthesiologist   Preanesthetic Checklist Completed: patient identified, site marked, surgical consent, pre-op evaluation, timeout performed, IV checked, risks and benefits discussed and monitors and equipment checked  Epidural Patient position: sitting Prep: site prepped and draped and DuraPrep Patient monitoring: continuous pulse ox and blood pressure Approach: midline Location: L3-L4 Injection technique: LOR air  Needle:  Needle type: Tuohy  Needle gauge: 17 G Needle length: 9 cm and 9 Needle insertion depth: 5 cm cm Catheter type: closed end flexible Catheter size: 19 Gauge Catheter at skin depth: 10 cm Test dose: negative and Other  Assessment Events: blood not aspirated, injection not painful, no injection resistance, negative IV test and no paresthesia  Additional Notes Reason for block:procedure for pain

## 2018-11-25 NOTE — MAU Note (Signed)
Started contracting at 0409 were every 10 min. Now every 3-5.  Feeling pressure. No bleeding or gushes.  Felt a little damp in the car.  Baby is very active today. Was ~3 on Friday. No problems with preg.

## 2018-11-25 NOTE — Progress Notes (Signed)
Pt informed that the ultrasound is considered a limited OB ultrasound and is not intended to be a complete ultrasound exam.  Patient also informed that the ultrasound is not being completed with the intent of assessing for fetal or placental anomalies or any pelvic abnormalities.  Explained that the purpose of today's ultrasound is to assess for  presentation.  Patient acknowledges the purpose of the exam and the limitations of the study.    Cephalic  Dunbar Buras, NP   

## 2018-11-25 NOTE — H&P (Signed)
Catherine Patel is a 33 year old G 2 P 1001 at 39 weeks presents in active labor. Now waiting for an epidural OB History    Gravida  2   Para  1   Term  1   Preterm  0   AB  0   Living  1     SAB  0   TAB  0   Ectopic  0   Multiple  0   Live Births  1          Past Medical History:  Diagnosis Date  . Anxiety    on meds doing well  . Asthma   . Eczema   . Fracture dislocation of left wrist 2016  . Gestational diabetes   . Leg fracture, right    as child   Past Surgical History:  Procedure Laterality Date  . CHOLECYSTECTOMY  01/10/11  . MOUTH SURGERY     Family History: family history includes Cancer in her maternal grandfather and maternal grandmother; Deep vein thrombosis in her father; Diabetes in her father, paternal grandfather, and paternal grandmother; Hypertension in her mother; Hyperthyroidism in her maternal grandmother; Hypothyroidism in her mother; Pancreatic cancer in her father. Social History:  reports that she has never smoked. She has never used smokeless tobacco. She reports previous alcohol use. She reports that she does not use drugs.     Maternal Diabetes: No Genetic Screening: Normal Maternal Ultrasounds/Referrals: Normal Fetal Ultrasounds or other Referrals:  None Maternal Substance Abuse:  No Significant Maternal Medications:  None Significant Maternal Lab Results:  None Other Comments:  None  Review of Systems  All other systems reviewed and are negative.  Maternal Medical History:  Reason for admission: Contractions.   Prenatal complications: no prenatal complications   Dilation: 6 Effacement (%): 90 Exam by:: F. Morris, RNC Blood pressure 102/64, pulse (!) 59, temperature 98.4 F (36.9 C), temperature source Oral, resp. rate 18, height 5\' 5"  (1.651 m), weight 96.9 kg, last menstrual period 02/25/2018, SpO2 100 %, unknown if currently breastfeeding. Maternal Exam:  Uterine Assessment: Contraction strength is moderate.   Contraction frequency is regular.   Abdomen: Fetal presentation: vertex     Fetal Exam Fetal State Assessment: Category I - tracings are normal.     Physical Exam  Nursing note and vitals reviewed. Constitutional: She appears well-developed and well-nourished.  HENT:  Head: Normocephalic.  Eyes: Pupils are equal, round, and reactive to light.  Neck: Normal range of motion.  Cardiovascular: Normal rate and regular rhythm.  Respiratory: Effort normal.  GI: Soft.    Prenatal labs: ABO, Rh: A/Positive/-- (04/17 0000) Antibody: Negative (04/17 0000) Rubella: Immune (04/17 0000) RPR: Nonreactive (04/17 0000)  HBsAg: Negative (04/17 0000)  HIV: Non-reactive (04/17 0000)  GBS: Negative/-- (10/30 0000)   Assessment/Plan: IUP at term Epidural  arom  Anticipate NSVD  Cyril Mourning 11/25/2018, 9:15 AM

## 2018-11-25 NOTE — Anesthesia Preprocedure Evaluation (Signed)
Anesthesia Evaluation  Patient identified by MRN, date of birth, ID band Patient awake    Reviewed: Allergy & Precautions, Patient's Chart, lab work & pertinent test results  Airway Mallampati: II  TM Distance: >3 FB Neck ROM: Full    Dental no notable dental hx. (+) Teeth Intact   Pulmonary asthma ,    Pulmonary exam normal breath sounds clear to auscultation       Cardiovascular negative cardio ROS Normal cardiovascular exam Rhythm:Regular Rate:Normal     Neuro/Psych Anxiety negative neurological ROS     GI/Hepatic Neg liver ROS, GERD  ,  Endo/Other  diabetes, Well ControlledObesity  Renal/GU negative Renal ROS  negative genitourinary   Musculoskeletal negative musculoskeletal ROS (+)   Abdominal (+) + obese,   Peds  Hematology  (+) anemia ,   Anesthesia Other Findings   Reproductive/Obstetrics (+) Pregnancy                             Anesthesia Physical Anesthesia Plan  ASA: II  Anesthesia Plan: Epidural   Post-op Pain Management:    Induction:   PONV Risk Score and Plan:   Airway Management Planned: Natural Airway  Additional Equipment:   Intra-op Plan:   Post-operative Plan:   Informed Consent: I have reviewed the patients History and Physical, chart, labs and discussed the procedure including the risks, benefits and alternatives for the proposed anesthesia with the patient or authorized representative who has indicated his/her understanding and acceptance.       Plan Discussed with: Anesthesiologist  Anesthesia Plan Comments:         Anesthesia Quick Evaluation

## 2018-11-26 LAB — CBC
HCT: 31.9 % — ABNORMAL LOW (ref 36.0–46.0)
Hemoglobin: 10.6 g/dL — ABNORMAL LOW (ref 12.0–15.0)
MCH: 30 pg (ref 26.0–34.0)
MCHC: 33.2 g/dL (ref 30.0–36.0)
MCV: 90.4 fL (ref 80.0–100.0)
Platelets: 223 10*3/uL (ref 150–400)
RBC: 3.53 MIL/uL — ABNORMAL LOW (ref 3.87–5.11)
RDW: 14.3 % (ref 11.5–15.5)
WBC: 14.6 10*3/uL — ABNORMAL HIGH (ref 4.0–10.5)
nRBC: 0 % (ref 0.0–0.2)

## 2018-11-26 LAB — RUBELLA SCREEN: Rubella: 2.3 index (ref >0.99)

## 2018-11-26 MED ORDER — LIDOCAINE HCL 1 % IJ SOLN: 0.8000 mL | Freq: Once | INTRAMUSCULAR | Status: DC

## 2018-11-26 MED ORDER — ACETAMINOPHEN FOR CIRCUMCISION 160 MG/5 ML: 40.0000 mg | ORAL | Status: DC | PRN

## 2018-11-26 MED ORDER — EPINEPHRINE TOPICAL FOR CIRCUMCISION 0.1 MG/ML: 1.0000 [drp] | TOPICAL | Status: DC | PRN

## 2018-11-26 MED ORDER — ACETAMINOPHEN FOR CIRCUMCISION 160 MG/5 ML: 40.0000 mg | Freq: Once | ORAL | Status: DC

## 2018-11-26 MED ORDER — LIDOCAINE HCL (PF) 1 % IJ SOLN
0.8000 mL | Freq: Once | INTRAMUSCULAR | Status: DC
  Filled 2018-11-26: qty 2

## 2018-11-26 MED ORDER — LIDOCAINE 1% INJECTION FOR CIRCUMCISION
0.8000 mL | INJECTION | Freq: Once | INTRAVENOUS | Status: DC
  Filled 2018-11-26: qty 1

## 2018-11-26 MED ORDER — WHITE PETROLATUM EX OINT: 1.0000 "application " | TOPICAL_OINTMENT | CUTANEOUS | Status: DC | PRN

## 2018-11-26 MED ORDER — SUCROSE 24% NICU/PEDS ORAL SOLUTION: 0.5000 mL | OROMUCOSAL | Status: DC | PRN

## 2018-11-26 NOTE — Anesthesia Postprocedure Evaluation (Signed)
Anesthesia Post Note  Patient: Catherine Patel  Procedure(s) Performed: AN AD [facility]     Patient location during evaluation: Mother Baby Anesthesia Type: Epidural Level of consciousness: awake, awake and alert and oriented Pain management: pain level controlled Vital Signs Assessment: post-procedure vital signs reviewed and stable Respiratory status: spontaneous breathing and respiratory function stable Cardiovascular status: blood pressure returned to baseline Postop Assessment: no headache, epidural receding, patient able to bend at knees, adequate PO intake, no backache, no apparent nausea or vomiting and able to ambulate Anesthetic complications: no    Last Vitals:  Vitals:   11/25/18 2025 11/26/18 0500  BP: 125/83 132/86  Pulse: 72 67  Resp: 16 16  Temp: 37.1 C   SpO2: 99% 99%    Last Pain:  Vitals:   11/25/18 2325  TempSrc:   PainSc: 5    Pain Goal:                   Bufford Spikes

## 2018-11-26 NOTE — Procedures (Deleted)
Informed consent obtained from mother including discussion of medical necessity, cannot guarantee cosmetic outcome, risk of incomplete procedure due to diagnosis of urethral abnormalities, risk of bleeding and infection. 1 cc 1% plain lidocaine used for penile block after sterile prep and drape.  Uncomplicated circumcision done with 1.3 Gomco. Hemostasis with Gelfoam. Tolerated well, minimal blood loss.   Luz Lex MD 11/26/2018 5:46 PM

## 2018-11-26 NOTE — Progress Notes (Signed)
Post Partum Day 1 Subjective: no complaints, up ad lib, voiding and tolerating PO  Objective: Blood pressure 132/86, pulse 67, temperature 98.7 F (37.1 C), temperature source Oral, resp. rate 16, height 5\' 5"  (1.651 m), weight 96.9 kg, last menstrual period 02/25/2018, SpO2 99 %, unknown if currently breastfeeding.  Physical Exam:  General: alert, cooperative, appears stated age and no distress Lochia: appropriate Uterine Fundus: firm Incision: healing well DVT Evaluation: No evidence of DVT seen on physical exam.  Recent Labs    11/25/18 0837 11/26/18 0505  HGB 11.2* 10.6*  HCT 33.6* 31.9*    Assessment/Plan: Plan for discharge tomorrow, Breastfeeding and Circumcision prior to discharge   LOS: 1 day   Luz Lex 11/26/2018, 9:17 AM

## 2018-11-26 NOTE — Lactation Note (Signed)
This note was copied from a baby's chart. Lactation Consultation Note  Patient Name: Catherine Patel [id number] Date: 11/26/2018 Reason for consult: Initial assessment;Term;Infant weight loss P2, 18 hour female infant. Per parents, infant had 3 voids and 2 stools. Per mom, she has DEBP at home. Dad was doing STS when [facility] entered the room. Per mom, she breastfeed her 32 year old for 23 months. Per mom, infant been latching most feeds 30 minutes. Mom latched infant on right breast using football hold, swallows observed and infant was still breastfeeding after 7 minutes. Mom knows to breast according hunger cues, 8 to 12 times within 24 hours and on demand. Mom made aware of O/P services, breastfeeding support groups, community resources, and our phone # for post-discharge questions.   Maternal Data Formula Feeding for Exclusion: No Has patient been taught Hand Expression?: Yes Does the patient have breastfeeding experience prior to this delivery?: Yes  Feeding Feeding Type: Breast Fed  LATCH Score Latch: Grasps breast easily, tongue down, lips flanged, rhythmical sucking.  Audible Swallowing: Spontaneous and intermittent  Type of Nipple: Everted at rest and after stimulation  Comfort (Breast/Nipple): Soft / non-tender  Hold (Positioning): Assistance needed to correctly position infant at breast and maintain latch.  LATCH Score: 9  Interventions Interventions: Breast feeding basics reviewed;Breast compression;Assisted with latch;Adjust position;Skin to skin;Support pillows;Breast massage;Position options;Hand express;Expressed milk;DEBP  Lactation Tools Discussed/Used WIC Program: No   Consult Status Consult Status: Follow-up Date: 11/26/18 Follow-up type: In-patient    Vicente Serene 11/26/2018, 7:05 AM

## 2018-11-27 NOTE — Discharge Summary (Signed)
Obstetric Discharge Summary Reason for Admission: onset of labor Prenatal Procedures: none Intrapartum Procedures: spontaneous vaginal delivery Postpartum Procedures: none Complications-Operative and Postpartum: none Hemoglobin  Date Value Ref Range Status  11/26/2018 10.6 (L) 12.0 - 15.0 g/dL Final   HCT  Date Value Ref Range Status  11/26/2018 31.9 (L) 36.0 - 46.0 % Final    Physical Exam:  General: alert, cooperative and appears stated age 33: appropriate Uterine Fundus: firm Incision: N/A DVT Evaluation: No evidence of DVT seen on physical exam. Negative Homan's sign. No cords or calf tenderness. No significant calf/ankle edema.  Discharge Diagnoses: Term Pregnancy-delivered  Discharge Information: Date: 11/27/2018 Activity: pelvic rest Diet: routine Medications: PNV Condition: stable Instructions: refer to practice specific booklet Discharge to: home   Newborn Data: Live born female  Birth Weight: 9 lb 4.5 oz (4210 g) APGAR: 2, 9  Newborn Delivery   Birth date/time: 11/25/2018 12:27:00 Delivery type: Vaginal, Spontaneous      Home with mother.  Catherine Patel Catherine Patel 11/27/2018, 9:07 AM

## 2018-11-27 NOTE — Lactation Note (Signed)
This note was copied from a baby's chart. Lactation Consultation Note  Patient Name: Boy Kilie Rund [id number] Date: 11/27/2018 Reason for consult: Follow-up assessment;Term  [phone] Discharge visit with P2 mom who delivered @ 39wks, baby is now 28 hours old with 7% wt loss.  LC entered room to find mom in bed with baby latched to left breast in cradle hold. Mom denies any pain with latch or any issues with breastfeeding; states is it going much better than with last child. Husband at bedside and engaged.  Reviewed basic latch techniques. Infant currently with nearly perfect latch: lips flanged, wide angle of mouth, nose/chin touching breast, STS with mom. Only critique is to turn infant so he is tummy to tummy with mom and ears/shoulders/hips can be in alignment. Praised mom for efforts and success thus far with latching.  Reviewed expected I & O of newborn and milk coming to volume.  Reviewed engorgement prevention and treatment with ice not heat.  Reviewed lactation brochure with phone numbers for follow-up and/or OP consultation; support group information on [email or web address].  Maternal Data Does the patient have breastfeeding experience prior to this delivery?: Yes  Feeding Feeding Type: Breast Fed  LATCH Score Latch: Grasps breast easily, tongue down, lips flanged, rhythmical sucking.  Audible Swallowing: Spontaneous and intermittent  Type of Nipple: Everted at rest and after stimulation  Comfort (Breast/Nipple): Soft / non-tender  Hold (Positioning): No assistance needed to correctly position infant at breast.  LATCH Score: 10  Interventions Interventions: Breast feeding basics reviewed;DEBP;Ice;Position options  Lactation Tools Discussed/Used     Consult Status Consult Status: Complete Follow-up type: Call as needed    Cranston Neighbor 11/27/2018, 9:44 AM

## 2018-12-22 DIAGNOSIS — F411 Generalized anxiety disorder: Secondary | ICD-10-CM | POA: Diagnosis not present

## 2018-12-22 DIAGNOSIS — F408 Other phobic anxiety disorders: Secondary | ICD-10-CM | POA: Diagnosis not present

## 2018-12-22 DIAGNOSIS — F331 Major depressive disorder, recurrent, moderate: Secondary | ICD-10-CM | POA: Diagnosis not present

## 2019-01-08 DIAGNOSIS — Z1389 Encounter for screening for other disorder: Secondary | ICD-10-CM | POA: Diagnosis not present

## 2019-01-20 DIAGNOSIS — Z30430 Encounter for insertion of intrauterine contraceptive device: Secondary | ICD-10-CM | POA: Diagnosis not present

## 2019-01-20 DIAGNOSIS — Z3202 Encounter for pregnancy test, result negative: Secondary | ICD-10-CM | POA: Diagnosis not present

## 2019-02-02 DIAGNOSIS — F408 Other phobic anxiety disorders: Secondary | ICD-10-CM | POA: Diagnosis not present

## 2019-02-02 DIAGNOSIS — F331 Major depressive disorder, recurrent, moderate: Secondary | ICD-10-CM | POA: Diagnosis not present

## 2019-02-02 DIAGNOSIS — F411 Generalized anxiety disorder: Secondary | ICD-10-CM | POA: Diagnosis not present

## 2019-02-02 DIAGNOSIS — R0989 Other specified symptoms and signs involving the circulatory and respiratory systems: Secondary | ICD-10-CM | POA: Diagnosis not present

## 2019-02-02 DIAGNOSIS — Z20822 Contact with and (suspected) exposure to covid-19: Secondary | ICD-10-CM | POA: Diagnosis not present

## 2019-02-02 DIAGNOSIS — R0981 Nasal congestion: Secondary | ICD-10-CM | POA: Diagnosis not present

## 2019-02-18 ENCOUNTER — Telehealth: Payer: BC Managed Care – PPO | Admitting: Nurse Practitioner

## 2019-02-18 DIAGNOSIS — J329 Chronic sinusitis, unspecified: Secondary | ICD-10-CM

## 2019-02-18 DIAGNOSIS — L738 Other specified follicular disorders: Secondary | ICD-10-CM

## 2019-02-18 DIAGNOSIS — B9789 Other viral agents as the cause of diseases classified elsewhere: Secondary | ICD-10-CM

## 2019-02-18 MED ORDER — MUPIROCIN 2 % EX OINT: 1.0000 "application " | TOPICAL_OINTMENT | Freq: Two times a day (BID) | CUTANEOUS | 0 refills | Status: AC

## 2019-02-18 NOTE — Progress Notes (Signed)
E Visit for Cellulitis  We are sorry that you are not feeling well. Here is how we plan to help!  Based on what you shared with me it looks like you have cellulitis of nares.  Cellulitis looks like areas of skin redness, swelling, and warmth; it develops as a result of bacteria entering under the skin. Little red spots and/or bleeding can be seen in skin, and tiny surface sacs containing fluid can occur. Fever can be present. Cellulitis is almost always on one side of a body, and the lower limbs are the most common site of involvement.   I have prescribed:  bactroban oint to be applied 3x a day  HOME CARE:  . Take your medications as ordered and take all of them, even if the skin irritation appears to be healing.   GET HELP RIGHT AWAY IF:  . Symptoms that don't begin to go away within 48 hours. . Severe redness persists or worsens . If the area turns color, spreads or swells. . If it blisters and opens, develops yellow-brown crust or bleeds. . You develop a fever or chills. . If the pain increases or becomes unbearable.  . Are unable to keep fluids and food down.  MAKE SURE YOU    Understand these instructions.  Will watch your condition.  Will get help right away if you are not doing well or get worse.  We are sorry that you are not feeling well.  Here is how we plan to help!  Based on what you have shared with me it looks like you have sinusitis.  Sinusitis is inflammation and infection in the sinus cavities of the head.  Based on your presentation I believe you most likely have Acute Viral Sinusitis.This is an infection most likely caused by a virus. There is not specific treatment for viral sinusitis other than to help you with the symptoms until the infection runs its course.  You may use an oral decongestant such as Mucinex D or if you have glaucoma or high blood pressure use plain Mucinex. Saline nasal spray help and can safely be used as often as needed for  congestion.  Some authorities believe that zinc sprays or the use of Echinacea may shorten the course of your symptoms.  Sinus infections are not as easily transmitted as other respiratory infection, however we still recommend that you avoid close contact with loved ones, especially the very young and elderly.  Remember to wash your hands thoroughly throughout the day as this is the number one way to prevent the spread of infection!  Home Care:  Only take medications as instructed by your medical team.  Do not take these medications with alcohol.  A steam or ultrasonic humidifier can help congestion.  You can place a towel over your head and breathe in the steam from hot water coming from a faucet.  Avoid close contacts especially the very young and the elderly.  Cover your mouth when you cough or sneeze.  Always remember to wash your hands.  Get Help Right Away If:  You develop worsening fever or sinus pain.  You develop a severe head ache or visual changes.  Your symptoms persist after you have completed your treatment plan.  Make sure you  Understand these instructions.  Will watch your condition.  Will get help right away if you are not doing well or get worse.  Your e-visit answers were reviewed by a board certified advanced clinical practitioner to complete your personal  care plan.  Depending on the condition, your plan could have included both over the counter or prescription medications.  If there is a problem please reply  once you have received a response from your provider.  Your safety is important to [place].  If you have drug allergies check your prescription carefully.    You can use MyChart to ask questions about today's visit, request a non-urgent call back, or ask for a work or school excuse for 24 hours related to this e-Visit. If it has been greater than 24 hours you will need to follow up with your provider, or enter a new e-Visit to address those  concerns.  You will get an e-mail in the next two days asking about your experience.  I hope that your e-visit has been valuable and will speed your recovery. Thank you for using e-visits.   5-10 minutes spent reviewing and documenting in chart.

## 2019-03-30 DIAGNOSIS — F339 Major depressive disorder, recurrent, unspecified: Secondary | ICD-10-CM | POA: Diagnosis not present

## 2019-05-15 DIAGNOSIS — Z0000 Encounter for general adult medical examination without abnormal findings: Secondary | ICD-10-CM | POA: Diagnosis not present

## 2019-05-15 DIAGNOSIS — R5383 Other fatigue: Secondary | ICD-10-CM | POA: Diagnosis not present

## 2019-05-22 DIAGNOSIS — Z1331 Encounter for screening for depression: Secondary | ICD-10-CM | POA: Diagnosis not present

## 2019-05-22 DIAGNOSIS — Z0000 Encounter for general adult medical examination without abnormal findings: Secondary | ICD-10-CM | POA: Diagnosis not present

## 2019-05-22 DIAGNOSIS — F419 Anxiety disorder, unspecified: Secondary | ICD-10-CM | POA: Diagnosis not present

## 2019-05-22 DIAGNOSIS — R635 Abnormal weight gain: Secondary | ICD-10-CM | POA: Diagnosis not present

## 2019-07-08 DIAGNOSIS — Z20822 Contact with and (suspected) exposure to covid-19: Secondary | ICD-10-CM | POA: Diagnosis not present

## 2019-07-08 DIAGNOSIS — R430 Anosmia: Secondary | ICD-10-CM | POA: Diagnosis not present

## 2019-07-08 DIAGNOSIS — J069 Acute upper respiratory infection, unspecified: Secondary | ICD-10-CM | POA: Diagnosis not present

## 2019-07-11 DIAGNOSIS — J0140 Acute pansinusitis, unspecified: Secondary | ICD-10-CM | POA: Diagnosis not present

## 2019-09-03 DIAGNOSIS — Z20828 Contact with and (suspected) exposure to other viral communicable diseases: Secondary | ICD-10-CM | POA: Diagnosis not present

## 2019-09-24 DIAGNOSIS — S060X0A Concussion without loss of consciousness, initial encounter: Secondary | ICD-10-CM | POA: Diagnosis not present

## 2019-09-24 DIAGNOSIS — Z885 Allergy status to narcotic agent status: Secondary | ICD-10-CM | POA: Diagnosis not present

## 2019-09-24 DIAGNOSIS — Z79899 Other long term (current) drug therapy: Secondary | ICD-10-CM | POA: Diagnosis not present

## 2019-09-24 DIAGNOSIS — S0101XA Laceration without foreign body of scalp, initial encounter: Secondary | ICD-10-CM | POA: Diagnosis not present

## 2019-09-24 DIAGNOSIS — W228XXA Striking against or struck by other objects, initial encounter: Secondary | ICD-10-CM | POA: Diagnosis not present

## 2019-09-24 DIAGNOSIS — F429 Obsessive-compulsive disorder, unspecified: Secondary | ICD-10-CM | POA: Diagnosis not present

## 2019-09-24 DIAGNOSIS — G44319 Acute post-traumatic headache, not intractable: Secondary | ICD-10-CM | POA: Diagnosis not present

## 2019-09-24 DIAGNOSIS — R519 Headache, unspecified: Secondary | ICD-10-CM | POA: Diagnosis not present

## 2019-09-24 DIAGNOSIS — Z888 Allergy status to other drugs, medicaments and biological substances status: Secondary | ICD-10-CM | POA: Diagnosis not present

## 2019-09-24 DIAGNOSIS — S0003XA Contusion of scalp, initial encounter: Secondary | ICD-10-CM | POA: Diagnosis not present

## 2019-10-06 DIAGNOSIS — S0003XA Contusion of scalp, initial encounter: Secondary | ICD-10-CM | POA: Diagnosis not present

## 2020-01-10 DIAGNOSIS — Z20822 Contact with and (suspected) exposure to covid-19: Secondary | ICD-10-CM | POA: Diagnosis not present

## 2020-02-19 DIAGNOSIS — F339 Major depressive disorder, recurrent, unspecified: Secondary | ICD-10-CM | POA: Diagnosis not present

## 2020-02-19 DIAGNOSIS — F428 Other obsessive-compulsive disorder: Secondary | ICD-10-CM | POA: Diagnosis not present

## 2020-03-08 DIAGNOSIS — R1013 Epigastric pain: Secondary | ICD-10-CM | POA: Diagnosis not present

## 2020-03-08 DIAGNOSIS — K219 Gastro-esophageal reflux disease without esophagitis: Secondary | ICD-10-CM | POA: Diagnosis not present

## 2020-08-11 DIAGNOSIS — H52222 Regular astigmatism, left eye: Secondary | ICD-10-CM | POA: Diagnosis not present

## 2020-08-24 DIAGNOSIS — F428 Other obsessive-compulsive disorder: Secondary | ICD-10-CM | POA: Diagnosis not present

## 2020-08-24 DIAGNOSIS — F339 Major depressive disorder, recurrent, unspecified: Secondary | ICD-10-CM | POA: Diagnosis not present

## 2020-09-01 DIAGNOSIS — F429 Obsessive-compulsive disorder, unspecified: Secondary | ICD-10-CM | POA: Diagnosis not present

## 2020-09-01 DIAGNOSIS — F430 Acute stress reaction: Secondary | ICD-10-CM | POA: Diagnosis not present

## 2020-09-01 DIAGNOSIS — F411 Generalized anxiety disorder: Secondary | ICD-10-CM | POA: Diagnosis not present

## 2020-09-06 DIAGNOSIS — F430 Acute stress reaction: Secondary | ICD-10-CM | POA: Diagnosis not present

## 2020-09-06 DIAGNOSIS — F411 Generalized anxiety disorder: Secondary | ICD-10-CM | POA: Diagnosis not present

## 2020-09-06 DIAGNOSIS — F429 Obsessive-compulsive disorder, unspecified: Secondary | ICD-10-CM | POA: Diagnosis not present

## 2020-09-13 DIAGNOSIS — F411 Generalized anxiety disorder: Secondary | ICD-10-CM | POA: Diagnosis not present

## 2020-09-13 DIAGNOSIS — F430 Acute stress reaction: Secondary | ICD-10-CM | POA: Diagnosis not present

## 2020-09-13 DIAGNOSIS — F429 Obsessive-compulsive disorder, unspecified: Secondary | ICD-10-CM | POA: Diagnosis not present

## 2020-09-20 DIAGNOSIS — F430 Acute stress reaction: Secondary | ICD-10-CM | POA: Diagnosis not present

## 2020-09-20 DIAGNOSIS — F429 Obsessive-compulsive disorder, unspecified: Secondary | ICD-10-CM | POA: Diagnosis not present

## 2020-09-20 DIAGNOSIS — F411 Generalized anxiety disorder: Secondary | ICD-10-CM | POA: Diagnosis not present

## 2020-09-29 DIAGNOSIS — F411 Generalized anxiety disorder: Secondary | ICD-10-CM | POA: Diagnosis not present

## 2020-09-29 DIAGNOSIS — F430 Acute stress reaction: Secondary | ICD-10-CM | POA: Diagnosis not present

## 2020-09-29 DIAGNOSIS — F429 Obsessive-compulsive disorder, unspecified: Secondary | ICD-10-CM | POA: Diagnosis not present

## 2020-10-06 DIAGNOSIS — F429 Obsessive-compulsive disorder, unspecified: Secondary | ICD-10-CM | POA: Diagnosis not present

## 2020-11-01 DIAGNOSIS — F429 Obsessive-compulsive disorder, unspecified: Secondary | ICD-10-CM | POA: Diagnosis not present

## 2020-11-01 DIAGNOSIS — N6320 Unspecified lump in the left breast, unspecified quadrant: Secondary | ICD-10-CM | POA: Diagnosis not present

## 2020-11-04 ENCOUNTER — Other Ambulatory Visit: Payer: Self-pay | Admitting: Obstetrics and Gynecology

## 2020-11-04 DIAGNOSIS — N6320 Unspecified lump in the left breast, unspecified quadrant: Secondary | ICD-10-CM

## 2020-11-16 DIAGNOSIS — F339 Major depressive disorder, recurrent, unspecified: Secondary | ICD-10-CM | POA: Diagnosis not present

## 2020-11-16 DIAGNOSIS — F428 Other obsessive-compulsive disorder: Secondary | ICD-10-CM | POA: Diagnosis not present

## 2020-11-17 DIAGNOSIS — F429 Obsessive-compulsive disorder, unspecified: Secondary | ICD-10-CM | POA: Diagnosis not present

## 2020-11-18 ENCOUNTER — Ambulatory Visit
Admission: RE | Admit: 2020-11-18 | Discharge: 2020-11-18 | Disposition: A | Discharge status: Home / Self Care | Payer: Self-pay | Source: Ambulatory Visit | Attending: Obstetrics and Gynecology | Admitting: Obstetrics and Gynecology

## 2020-11-18 ENCOUNTER — Ambulatory Visit
Admission: RE | Admit: 2020-11-18 | Discharge: 2020-11-18 | Disposition: A | Discharge status: Home / Self Care | Payer: BLUE CROSS/BLUE SHIELD | Source: Ambulatory Visit | Attending: Obstetrics and Gynecology | Admitting: Obstetrics and Gynecology

## 2020-11-18 DIAGNOSIS — N6320 Unspecified lump in the left breast, unspecified quadrant: Secondary | ICD-10-CM

## 2020-11-18 DIAGNOSIS — R922 Inconclusive mammogram: Secondary | ICD-10-CM | POA: Diagnosis not present

## 2020-11-24 DIAGNOSIS — F429 Obsessive-compulsive disorder, unspecified: Secondary | ICD-10-CM | POA: Diagnosis not present

## 2020-12-06 DIAGNOSIS — F429 Obsessive-compulsive disorder, unspecified: Secondary | ICD-10-CM | POA: Diagnosis not present

## 2021-01-03 DIAGNOSIS — F429 Obsessive-compulsive disorder, unspecified: Secondary | ICD-10-CM | POA: Diagnosis not present

## 2021-01-16 DIAGNOSIS — Z01419 Encounter for gynecological examination (general) (routine) without abnormal findings: Secondary | ICD-10-CM | POA: Diagnosis not present

## 2021-01-16 DIAGNOSIS — Z803 Family history of malignant neoplasm of breast: Secondary | ICD-10-CM | POA: Diagnosis not present

## 2021-01-16 DIAGNOSIS — Z6836 Body mass index (BMI) 36.0-36.9, adult: Secondary | ICD-10-CM | POA: Diagnosis not present

## 2021-01-16 DIAGNOSIS — R635 Abnormal weight gain: Secondary | ICD-10-CM | POA: Diagnosis not present

## 2021-01-16 DIAGNOSIS — Z309 Encounter for contraceptive management, unspecified: Secondary | ICD-10-CM | POA: Diagnosis not present

## 2021-01-16 DIAGNOSIS — Z808 Family history of malignant neoplasm of other organs or systems: Secondary | ICD-10-CM | POA: Diagnosis not present

## 2021-01-16 DIAGNOSIS — R5383 Other fatigue: Secondary | ICD-10-CM | POA: Diagnosis not present

## 2021-01-24 DIAGNOSIS — F429 Obsessive-compulsive disorder, unspecified: Secondary | ICD-10-CM | POA: Diagnosis not present

## 2021-02-03 DIAGNOSIS — K219 Gastro-esophageal reflux disease without esophagitis: Secondary | ICD-10-CM | POA: Diagnosis not present

## 2021-02-07 DIAGNOSIS — F429 Obsessive-compulsive disorder, unspecified: Secondary | ICD-10-CM | POA: Diagnosis not present

## 2021-02-08 DIAGNOSIS — F339 Major depressive disorder, recurrent, unspecified: Secondary | ICD-10-CM | POA: Diagnosis not present

## 2021-02-08 DIAGNOSIS — F428 Other obsessive-compulsive disorder: Secondary | ICD-10-CM | POA: Diagnosis not present

## 2021-02-28 DIAGNOSIS — Z9189 Other specified personal risk factors, not elsewhere classified: Secondary | ICD-10-CM | POA: Diagnosis not present

## 2021-02-28 DIAGNOSIS — Z809 Family history of malignant neoplasm, unspecified: Secondary | ICD-10-CM | POA: Diagnosis not present

## 2021-03-02 DIAGNOSIS — F429 Obsessive-compulsive disorder, unspecified: Secondary | ICD-10-CM | POA: Diagnosis not present

## 2021-03-30 DIAGNOSIS — F429 Obsessive-compulsive disorder, unspecified: Secondary | ICD-10-CM | POA: Diagnosis not present

## 2021-05-03 DIAGNOSIS — F428 Other obsessive-compulsive disorder: Secondary | ICD-10-CM | POA: Diagnosis not present

## 2021-05-03 DIAGNOSIS — F339 Major depressive disorder, recurrent, unspecified: Secondary | ICD-10-CM | POA: Diagnosis not present

## 2021-05-04 DIAGNOSIS — F429 Obsessive-compulsive disorder, unspecified: Secondary | ICD-10-CM | POA: Diagnosis not present

## 2021-05-15 DIAGNOSIS — R5383 Other fatigue: Secondary | ICD-10-CM | POA: Diagnosis not present

## 2021-05-15 DIAGNOSIS — R7302 Impaired glucose tolerance (oral): Secondary | ICD-10-CM | POA: Diagnosis not present

## 2021-05-22 DIAGNOSIS — Z0000 Encounter for general adult medical examination without abnormal findings: Secondary | ICD-10-CM | POA: Diagnosis not present

## 2021-05-22 DIAGNOSIS — K219 Gastro-esophageal reflux disease without esophagitis: Secondary | ICD-10-CM | POA: Diagnosis not present

## 2021-05-25 DIAGNOSIS — F429 Obsessive-compulsive disorder, unspecified: Secondary | ICD-10-CM | POA: Diagnosis not present

## 2021-05-30 ENCOUNTER — Encounter: Payer: Self-pay | Admitting: Family Medicine

## 2021-08-15 DIAGNOSIS — F339 Major depressive disorder, recurrent, unspecified: Secondary | ICD-10-CM | POA: Diagnosis not present

## 2021-08-15 DIAGNOSIS — F428 Other obsessive-compulsive disorder: Secondary | ICD-10-CM | POA: Diagnosis not present

## 2021-10-26 DIAGNOSIS — F429 Obsessive-compulsive disorder, unspecified: Secondary | ICD-10-CM | POA: Diagnosis not present

## 2021-11-02 DIAGNOSIS — F429 Obsessive-compulsive disorder, unspecified: Secondary | ICD-10-CM | POA: Diagnosis not present

## 2021-11-09 DIAGNOSIS — F429 Obsessive-compulsive disorder, unspecified: Secondary | ICD-10-CM | POA: Diagnosis not present

## 2021-11-16 DIAGNOSIS — F429 Obsessive-compulsive disorder, unspecified: Secondary | ICD-10-CM | POA: Diagnosis not present

## 2021-11-23 DIAGNOSIS — F429 Obsessive-compulsive disorder, unspecified: Secondary | ICD-10-CM | POA: Diagnosis not present

## 2021-12-05 DIAGNOSIS — F429 Obsessive-compulsive disorder, unspecified: Secondary | ICD-10-CM | POA: Diagnosis not present

## 2021-12-14 DIAGNOSIS — F429 Obsessive-compulsive disorder, unspecified: Secondary | ICD-10-CM | POA: Diagnosis not present

## 2022-01-18 DIAGNOSIS — F429 Obsessive-compulsive disorder, unspecified: Secondary | ICD-10-CM | POA: Diagnosis not present

## 2022-01-25 DIAGNOSIS — F429 Obsessive-compulsive disorder, unspecified: Secondary | ICD-10-CM | POA: Diagnosis not present

## 2022-02-01 DIAGNOSIS — F429 Obsessive-compulsive disorder, unspecified: Secondary | ICD-10-CM | POA: Diagnosis not present

## 2022-02-08 DIAGNOSIS — F429 Obsessive-compulsive disorder, unspecified: Secondary | ICD-10-CM | POA: Diagnosis not present

## 2022-02-13 DIAGNOSIS — F428 Other obsessive-compulsive disorder: Secondary | ICD-10-CM | POA: Diagnosis not present

## 2022-02-13 DIAGNOSIS — F339 Major depressive disorder, recurrent, unspecified: Secondary | ICD-10-CM | POA: Diagnosis not present

## 2022-02-15 DIAGNOSIS — F429 Obsessive-compulsive disorder, unspecified: Secondary | ICD-10-CM | POA: Diagnosis not present

## 2022-03-01 DIAGNOSIS — F429 Obsessive-compulsive disorder, unspecified: Secondary | ICD-10-CM | POA: Diagnosis not present

## 2022-03-08 DIAGNOSIS — F429 Obsessive-compulsive disorder, unspecified: Secondary | ICD-10-CM | POA: Diagnosis not present

## 2022-03-19 DIAGNOSIS — F429 Obsessive-compulsive disorder, unspecified: Secondary | ICD-10-CM | POA: Diagnosis not present

## 2022-04-03 DIAGNOSIS — F429 Obsessive-compulsive disorder, unspecified: Secondary | ICD-10-CM | POA: Diagnosis not present

## 2022-04-17 DIAGNOSIS — F429 Obsessive-compulsive disorder, unspecified: Secondary | ICD-10-CM | POA: Diagnosis not present

## 2022-04-24 DIAGNOSIS — F429 Obsessive-compulsive disorder, unspecified: Secondary | ICD-10-CM | POA: Diagnosis not present

## 2022-05-22 DIAGNOSIS — F429 Obsessive-compulsive disorder, unspecified: Secondary | ICD-10-CM | POA: Diagnosis not present

## 2022-06-18 DIAGNOSIS — R002 Palpitations: Secondary | ICD-10-CM | POA: Diagnosis not present

## 2022-06-18 DIAGNOSIS — E6609 Other obesity due to excess calories: Secondary | ICD-10-CM | POA: Diagnosis not present

## 2022-06-18 DIAGNOSIS — F419 Anxiety disorder, unspecified: Secondary | ICD-10-CM | POA: Diagnosis not present

## 2022-06-18 DIAGNOSIS — K219 Gastro-esophageal reflux disease without esophagitis: Secondary | ICD-10-CM | POA: Diagnosis not present

## 2022-06-18 DIAGNOSIS — Z6834 Body mass index (BMI) 34.0-34.9, adult: Secondary | ICD-10-CM | POA: Diagnosis not present

## 2022-06-19 DIAGNOSIS — F429 Obsessive-compulsive disorder, unspecified: Secondary | ICD-10-CM | POA: Diagnosis not present

## 2022-07-02 DIAGNOSIS — Z0000 Encounter for general adult medical examination without abnormal findings: Secondary | ICD-10-CM | POA: Diagnosis not present

## 2022-07-02 DIAGNOSIS — Z1331 Encounter for screening for depression: Secondary | ICD-10-CM | POA: Diagnosis not present

## 2022-07-02 DIAGNOSIS — F419 Anxiety disorder, unspecified: Secondary | ICD-10-CM | POA: Diagnosis not present

## 2022-07-02 DIAGNOSIS — Z1339 Encounter for screening examination for other mental health and behavioral disorders: Secondary | ICD-10-CM | POA: Diagnosis not present

## 2022-07-03 DIAGNOSIS — F429 Obsessive-compulsive disorder, unspecified: Secondary | ICD-10-CM | POA: Diagnosis not present

## 2022-07-11 DIAGNOSIS — F429 Obsessive-compulsive disorder, unspecified: Secondary | ICD-10-CM | POA: Diagnosis not present

## 2022-08-01 DIAGNOSIS — F429 Obsessive-compulsive disorder, unspecified: Secondary | ICD-10-CM | POA: Diagnosis not present

## 2022-08-14 DIAGNOSIS — F428 Other obsessive-compulsive disorder: Secondary | ICD-10-CM | POA: Diagnosis not present

## 2022-08-14 DIAGNOSIS — F339 Major depressive disorder, recurrent, unspecified: Secondary | ICD-10-CM | POA: Diagnosis not present

## 2022-08-21 DIAGNOSIS — F429 Obsessive-compulsive disorder, unspecified: Secondary | ICD-10-CM | POA: Diagnosis not present

## 2022-09-18 DIAGNOSIS — F429 Obsessive-compulsive disorder, unspecified: Secondary | ICD-10-CM | POA: Diagnosis not present

## 2022-10-02 IMAGING — US US BREAST*L* LIMITED INC AXILLA
1 series · 4 of 4 positions shown · non-contrast
Comparison: None.

CLINICAL DATA: 35-year-old female presenting for evaluation of a
tender palpable lump in the left breast for about 2 months. The
patient is currently breast feeding.

EXAM:
DIGITAL DIAGNOSTIC BILATERAL MAMMOGRAM WITH TOMOSYNTHESIS AND CAD;
ULTRASOUND LEFT BREAST LIMITED
TECHNIQUE: Bilateral digital diagnostic mammography and breast tomosynthesis
was performed. The images were evaluated with computer-aided
detection.; Targeted ultrasound examination of the left breast was
performed.

[Series 1: us breast*left* limited inc axilla · 0.07mm/px · 4 of 4 slices shown]
[im 1/4]
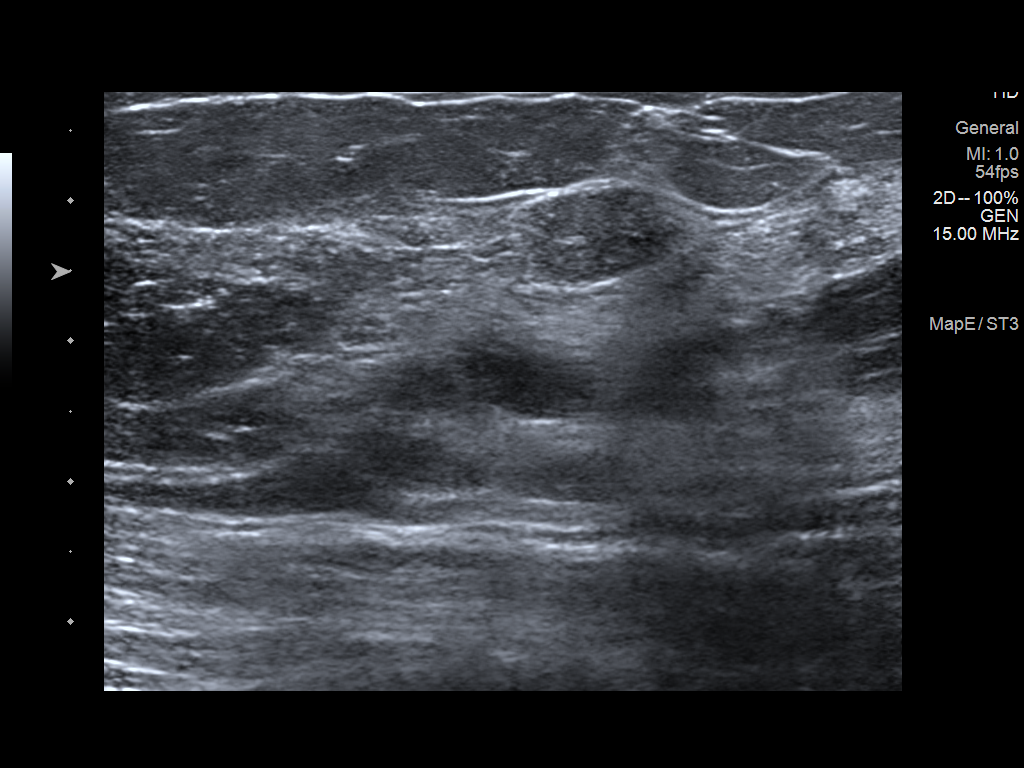
[im 2/4]
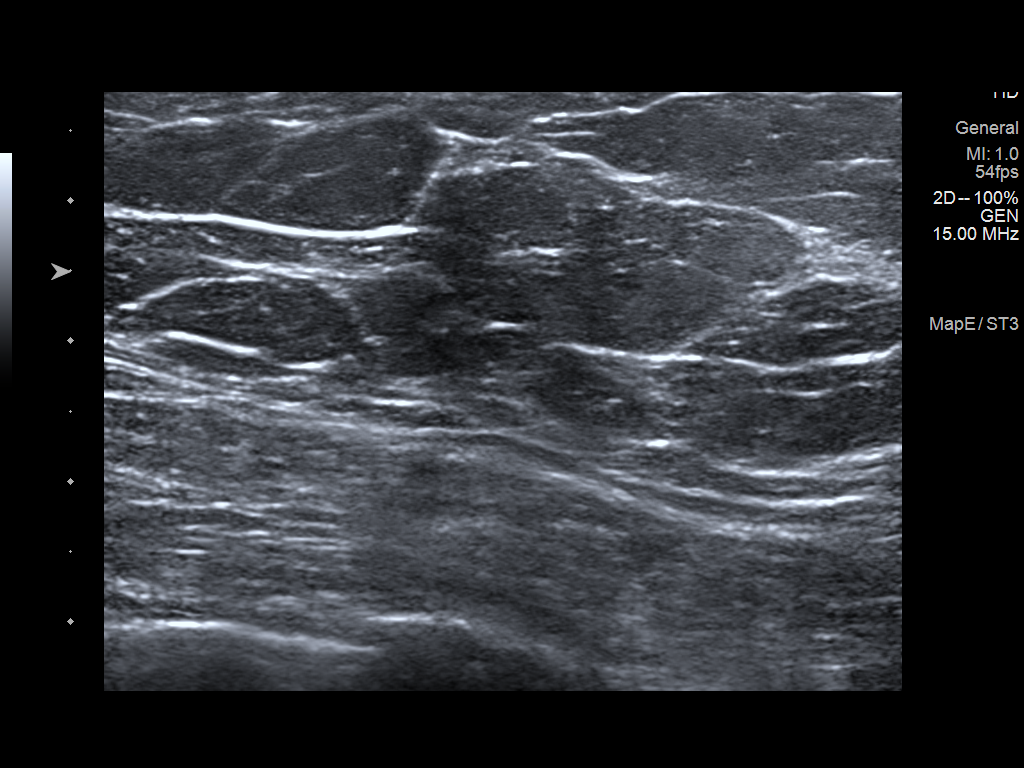
[im 3/4]
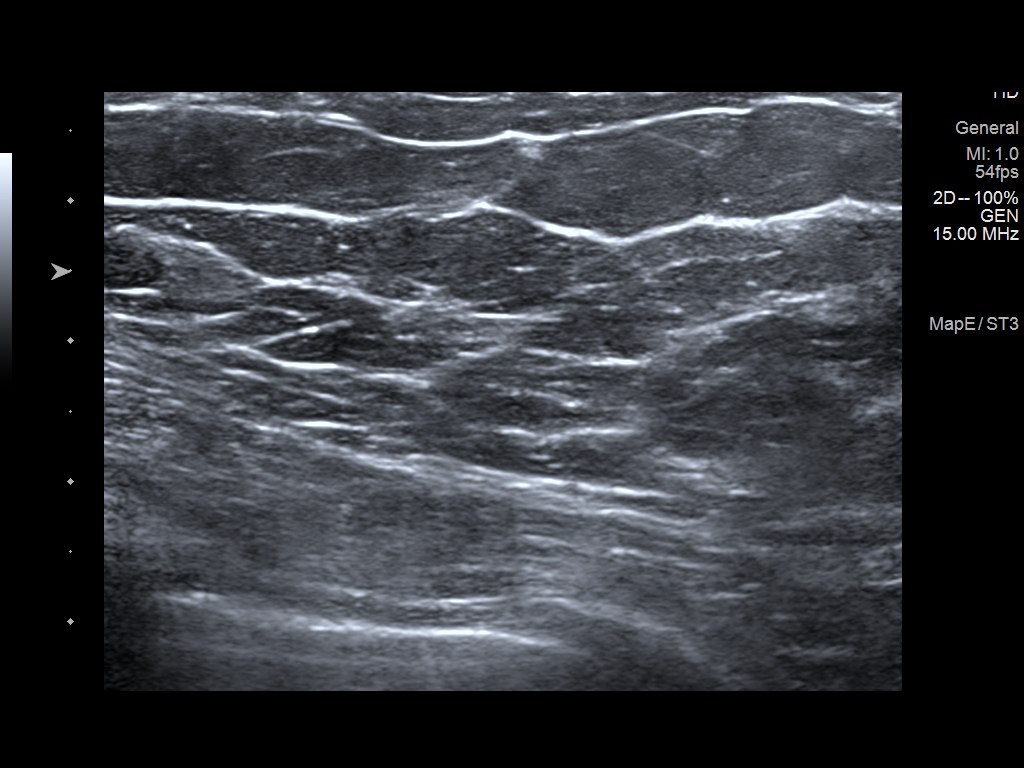
[im 4/4]
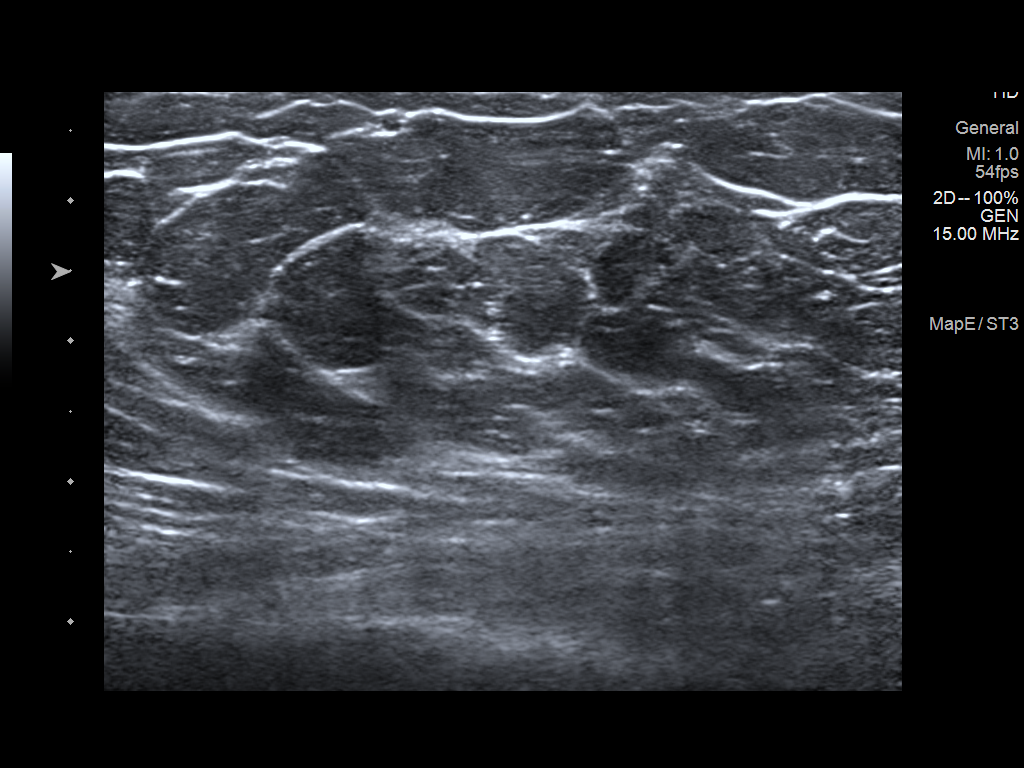

[4 of 4 positions shown; findings below may reference images not displayed]

ACR Breast Density Category c: The breast tissue is heterogeneously
dense, which may obscure small masses.
FINDINGS: A BB indicating the palpable site of concern has been placed along
the upper slightly outer aspect of the left breast. No suspicious
findings are identified deep to the palpable marker on the spot
compression tomosynthesis images. There is an asymmetry in the lower
outer quadrant of the right breast which resolves on spot
compression tomosynthesis imaging through this area. No other
suspicious calcifications, masses or areas of distortion are seen in
the bilateral breasts.

A firm elongated palpable ridge of tissue is identified at the
palpable site identified by patient.

Ultrasound targeted to the palpable area in the superior left breast
demonstrates normal fibroglandular tissue. No suspicious masses or
areas of shadowing are identified. Representative images were
acquired at 12 o'clock, 7 cm from the nipple.
IMPRESSION: 1. There are no suspicious mammographic or targeted sonographic
abnormalities at the palpable site of concern in the superior left
breast.

2.  No mammographic evidence of malignancy in the bilateral breasts.

RECOMMENDATION:
1. Clinical follow-up recommended for the palpable area of concern
in the left breast. Any further workup should be based on clinical
grounds.

2. Screening mammogram at age 40 unless there are persistent or
intervening clinical concerns. (Code:8P-K-CGP)

I have discussed the findings and recommendations with the patient.
If applicable, a reminder letter will be sent to the patient
regarding the next appointment.

BI-RADS CATEGORY  1: Negative.

## 2022-10-02 IMAGING — MG DIGITAL DIAGNOSTIC BILAT W/ TOMO W/ CAD
6 of 12 series · 6 of 36 positions shown · non-contrast
Comparison: None.

CLINICAL DATA: 35-year-old female presenting for evaluation of a
tender palpable lump in the left breast for about 2 months. The
patient is currently breast feeding.

EXAM:
DIGITAL DIAGNOSTIC BILATERAL MAMMOGRAM WITH TOMOSYNTHESIS AND CAD;
ULTRASOUND LEFT BREAST LIMITED
TECHNIQUE: Bilateral digital diagnostic mammography and breast tomosynthesis
was performed. The images were evaluated with computer-aided
detection.; Targeted ultrasound examination of the left breast was
performed.

[R CC synth-2D (1 of 2)]
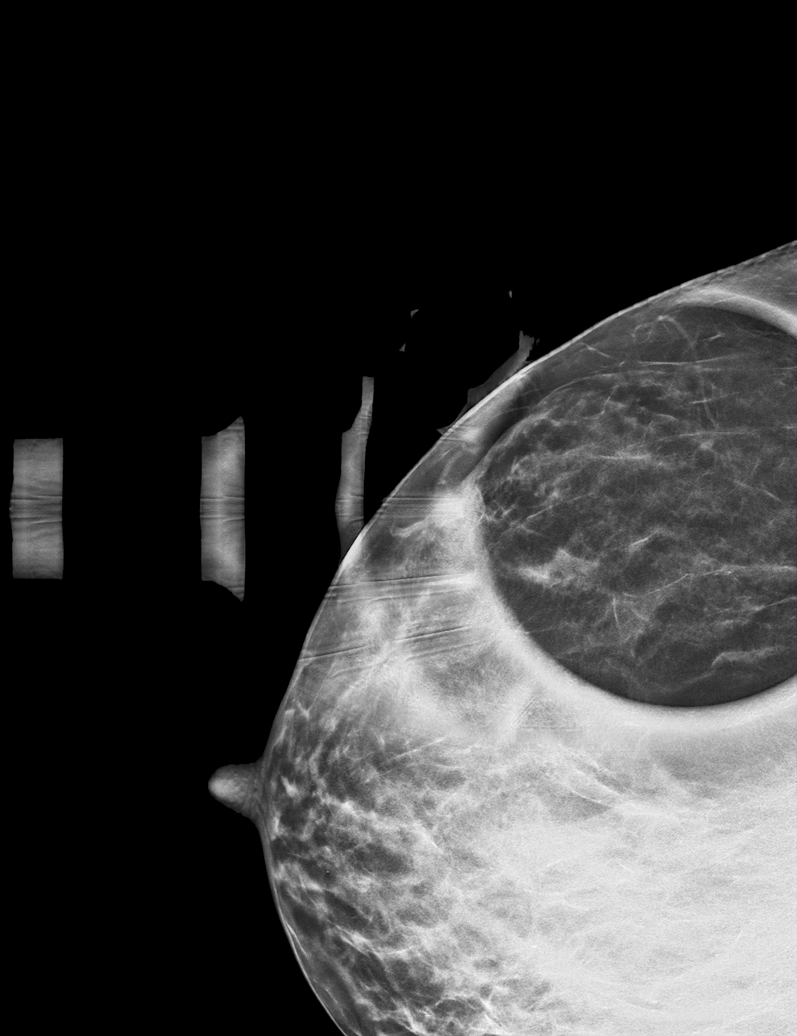

[R MLO synth-2D]
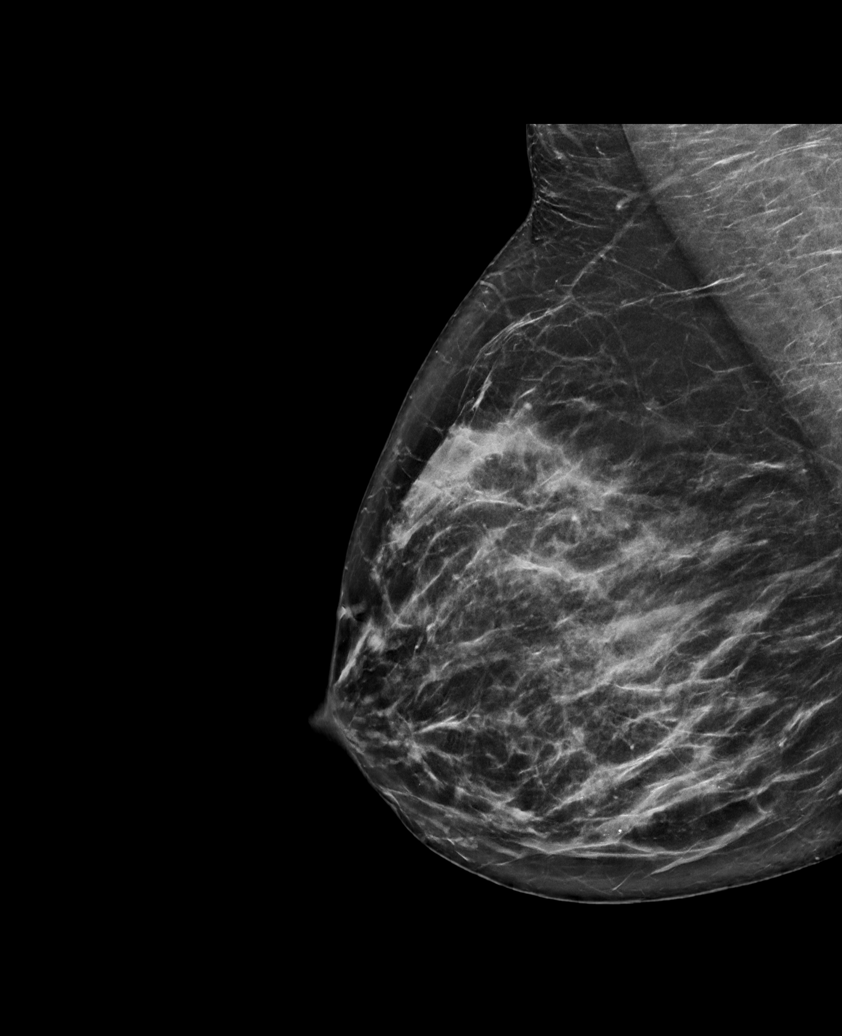

[L CC synth-2D]
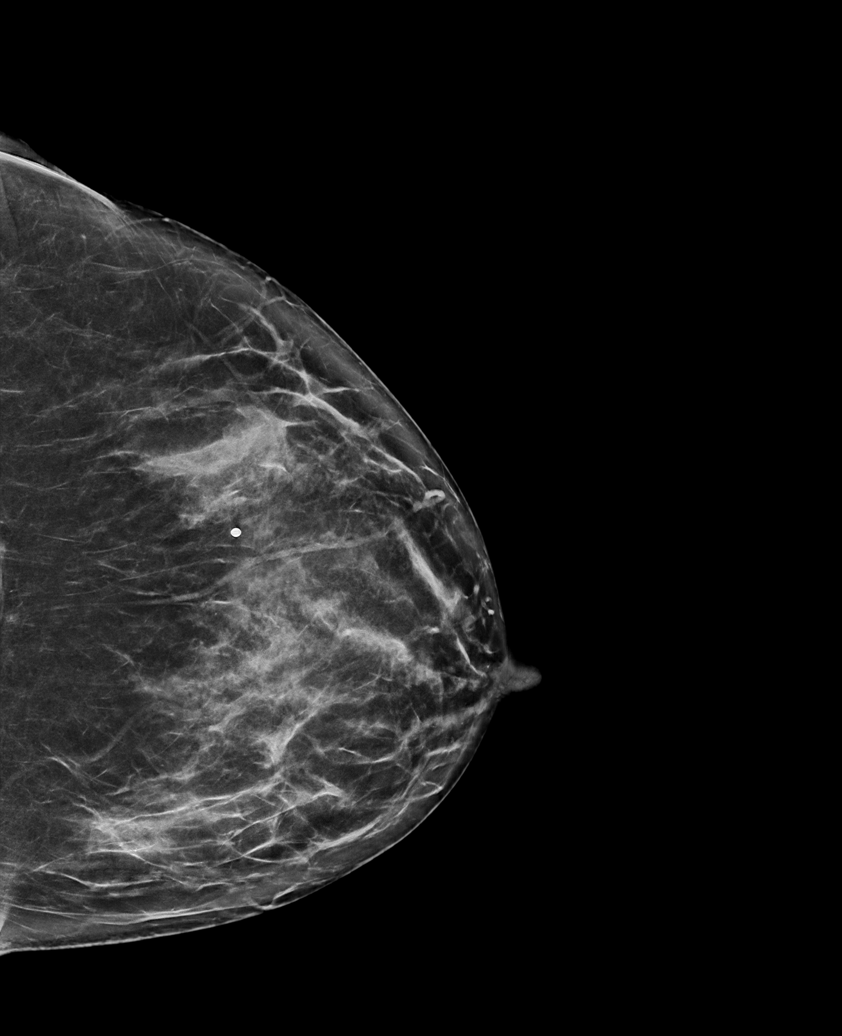

[L MLO synth-2D (1 of 2)]
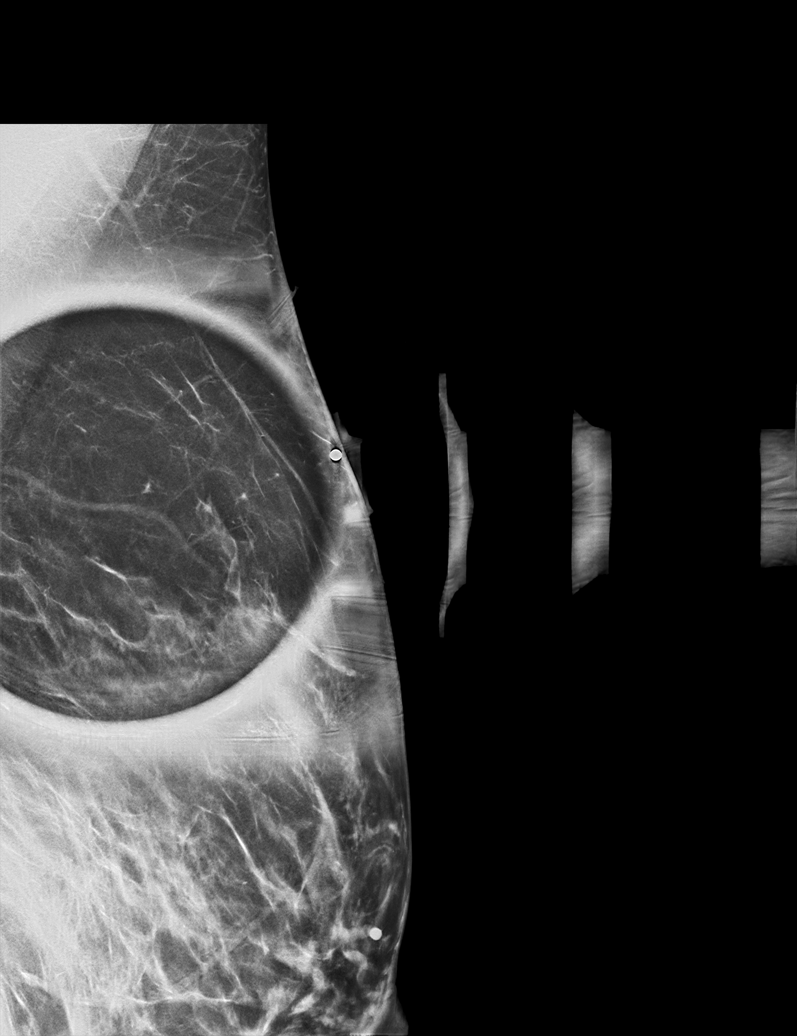

[R CC synth-2D (2 of 2)]
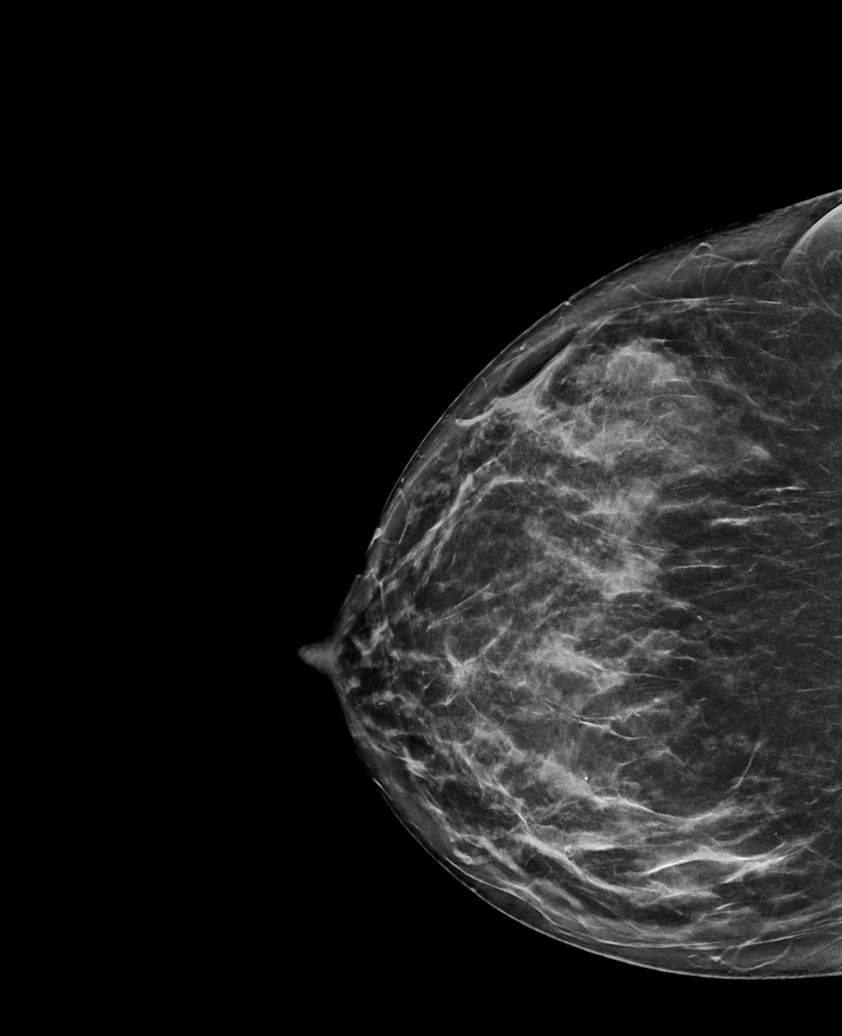

[L MLO synth-2D (2 of 2)]
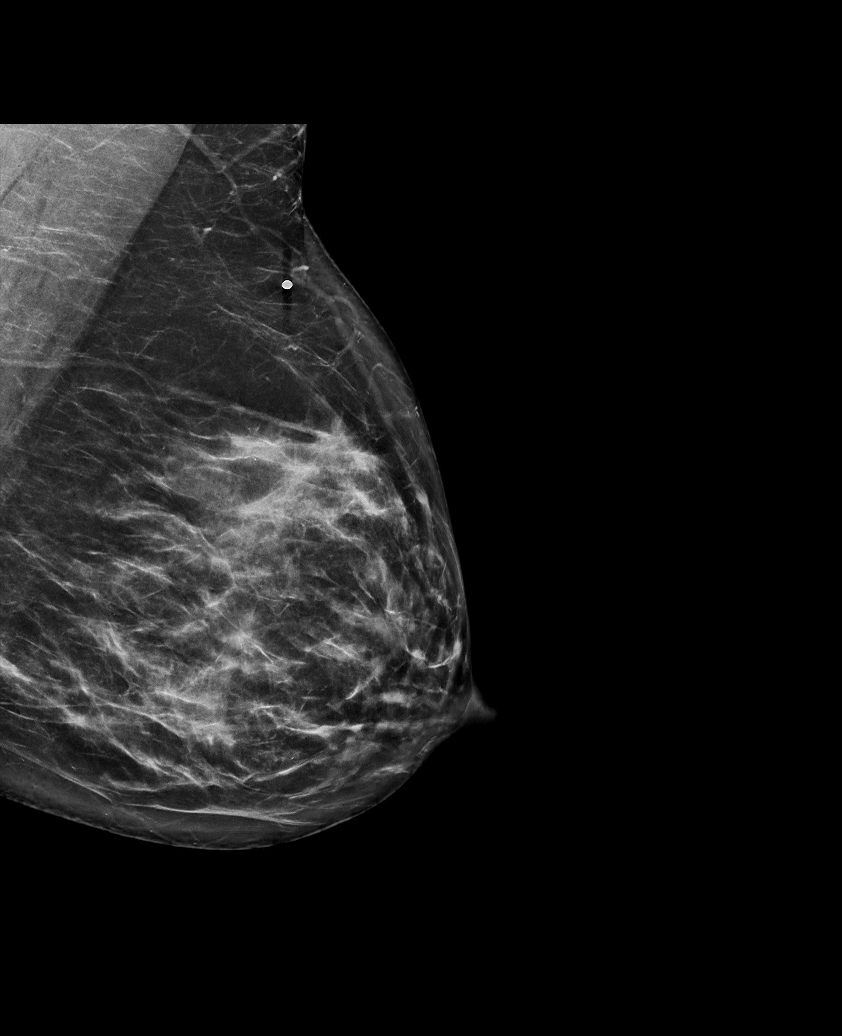

[6 of 36 positions shown; findings below may reference images not displayed]

ACR Breast Density Category c: The breast tissue is heterogeneously
dense, which may obscure small masses.
FINDINGS: A BB indicating the palpable site of concern has been placed along
the upper slightly outer aspect of the left breast. No suspicious
findings are identified deep to the palpable marker on the spot
compression tomosynthesis images. There is an asymmetry in the lower
outer quadrant of the right breast which resolves on spot
compression tomosynthesis imaging through this area. No other
suspicious calcifications, masses or areas of distortion are seen in
the bilateral breasts.

A firm elongated palpable ridge of tissue is identified at the
palpable site identified by patient.

Ultrasound targeted to the palpable area in the superior left breast
demonstrates normal fibroglandular tissue. No suspicious masses or
areas of shadowing are identified. Representative images were
acquired at 12 o'clock, 7 cm from the nipple.
IMPRESSION: 1. There are no suspicious mammographic or targeted sonographic
abnormalities at the palpable site of concern in the superior left
breast.

2.  No mammographic evidence of malignancy in the bilateral breasts.

RECOMMENDATION:
1. Clinical follow-up recommended for the palpable area of concern
in the left breast. Any further workup should be based on clinical
grounds.

2. Screening mammogram at age 40 unless there are persistent or
intervening clinical concerns. (Code:8P-K-CGP)

I have discussed the findings and recommendations with the patient.
If applicable, a reminder letter will be sent to the patient
regarding the next appointment.

BI-RADS CATEGORY  1: Negative.
